# Patient Record
Sex: Female | Born: 1990 | Race: Asian | Hispanic: No | Marital: Married | Smoking: Never smoker
Health system: Southern US, Community
[De-identification: ages and names within clinical notes are randomized; demographics above are authoritative.]

## PROBLEM LIST (undated history)

## (undated) ENCOUNTER — Inpatient Hospital Stay (HOSPITAL_COMMUNITY): Payer: Self-pay

## (undated) DIAGNOSIS — Z789 Other specified health status: Secondary | ICD-10-CM

## (undated) HISTORY — PX: NO PAST SURGERIES: SHX2092

---

## 2014-12-13 NOTE — L&D Delivery Note (Signed)
Delivery Note At 11:55 PM a viable female was delivered via Vaginal, Vacuum (Extractor) (Presentation: ; Occiput Anterior).  APGAR: 9, 9; weight 6 lb 13.9 oz (3116 g).   Placenta status: Intact, Spontaneous.  Cord: 3 vessels with the following complications: None.  Cord pH: none  Anesthesia: Epidural  Episiotomy: Midline Lacerations: 3rd degree;Perineal Suture Repair: 2.0 chromic vicryl Est. Blood Loss (mL): 325  Mom to postpartum.  Baby to Couplet care / Skin to Skin.  Ryne Mctigue A 11/23/2015, 12:57 AM

## 2015-04-29 LAB — OB RESULTS CONSOLE GC/CHLAMYDIA
CHLAMYDIA, DNA PROBE: NEGATIVE
GC PROBE AMP, GENITAL: NEGATIVE

## 2015-04-29 LAB — OB RESULTS CONSOLE RUBELLA ANTIBODY, IGM: Rubella: IMMUNE

## 2015-04-29 LAB — OB RESULTS CONSOLE RPR: RPR: NONREACTIVE

## 2015-04-29 LAB — OB RESULTS CONSOLE HIV ANTIBODY (ROUTINE TESTING): HIV: NONREACTIVE

## 2015-04-29 LAB — PROCEDURE REPORT - SCANNED: PAP SMEAR: NEGATIVE

## 2015-04-29 LAB — OB RESULTS CONSOLE HEPATITIS B SURFACE ANTIGEN: Hepatitis B Surface Ag: NEGATIVE

## 2015-06-02 ENCOUNTER — Encounter (HOSPITAL_COMMUNITY): Payer: Self-pay | Admitting: *Deleted

## 2015-06-02 ENCOUNTER — Inpatient Hospital Stay (HOSPITAL_COMMUNITY)
Admission: AD | Admit: 2015-06-02 | Discharge: 2015-06-02 | Disposition: A | Discharge status: Home / Self Care | Payer: Managed Care, Other (non HMO) | Source: Ambulatory Visit | Attending: Obstetrics | Admitting: Obstetrics

## 2015-06-02 DIAGNOSIS — Z3A14 14 weeks gestation of pregnancy: Secondary | ICD-10-CM | POA: Insufficient documentation

## 2015-06-02 DIAGNOSIS — R040 Epistaxis: Secondary | ICD-10-CM | POA: Insufficient documentation

## 2015-06-02 DIAGNOSIS — O210 Mild hyperemesis gravidarum: Secondary | ICD-10-CM | POA: Diagnosis present

## 2015-06-02 DIAGNOSIS — O219 Vomiting of pregnancy, unspecified: Secondary | ICD-10-CM

## 2015-06-02 LAB — URINALYSIS, ROUTINE W REFLEX MICROSCOPIC
BILIRUBIN URINE: NEGATIVE
GLUCOSE, UA: NEGATIVE mg/dL
HGB URINE DIPSTICK: NEGATIVE
Ketones, ur: NEGATIVE mg/dL
Leukocytes, UA: NEGATIVE
Nitrite: NEGATIVE
PH: 7 (ref 5.0–8.0)
Protein, ur: NEGATIVE mg/dL
SPECIFIC GRAVITY, URINE: 1.01 (ref 1.005–1.030)
UROBILINOGEN UA: 0.2 mg/dL (ref 0.0–1.0)

## 2015-06-02 NOTE — Discharge Instructions (Signed)

## 2015-06-02 NOTE — MAU Provider Note (Signed)
History     CSN: [id number]  Arrival date and time: 06/02/15 1039   First Provider Initiated Contact with Patient 06/02/15 1125      Chief Complaint  Patient presents with  . Morning Sickness   HPIpt is G1P0  pregnant - presents with nausea and vomiting. Pt ate part of biscuit this morning and able to keep fluids down today Pt has had a nose bleed 2 days ago and is concerned- pt has hx of nosebleeds Pt has phenergan which helps her nausea when she takes it- no nausea at this time Pt denies any spotting/bleeding/LOF/abd pain  RN note: Nurse Addendum  MAU Note 06/02/2015 10:51 AM    Expand All Collapse All   Having nausea and vomiting (has not thrown up today), having nose bleeding(occ last was yesterday). Has nausea med- trying not to take. Called doctor was told to come here. Sometimes has pain, none currently         History reviewed. No pertinent past medical history.  History reviewed. No pertinent past surgical history.  History reviewed. No pertinent family history.  History  Substance Use Topics  . Smoking status: Never Smoker   . Smokeless tobacco: Not on file  . Alcohol Use: No    Allergies: No Known Allergies  Prescriptions prior to admission  Medication Sig Dispense Refill Last Dose  . Prenatal Vit-Fe Fumarate-FA (PRENATAL MULTIVITAMIN) TABS tablet Take 1 tablet by mouth daily at 12 noon.   06/01/2015 at Unknown time  . promethazine (PHENERGAN) 25 MG tablet Take 25 mg by mouth every 4 (four) hours as needed for nausea or vomiting.   06/02/2015 at Unknown time    Review of Systems  Constitutional: Negative for fever and chills.  Gastrointestinal: Positive for nausea and vomiting. Negative for abdominal pain, diarrhea and constipation.  Genitourinary: Negative for dysuria.  Neurological: Negative for headaches.   Physical Exam   Blood pressure 104/74, pulse 87, temperature 98.4 F (36.9 C), temperature source Oral, resp. rate 16, height   (1.575 m), weight 114 lb (51.71 kg).  Physical Exam  Nursing note and vitals reviewed. Constitutional: She is oriented to person, place, and time. She appears well-developed and well-nourished. No distress.  HENT:  Head: Normocephalic.  Eyes: Pupils are equal, round, and reactive to light.  Neck: Normal range of motion. Neck supple.  Cardiovascular: Normal rate.   Respiratory: Effort normal.  GI: Soft. She exhibits no distension. There is no tenderness. There is no rebound and no guarding.  FHR 167 with doppler  Musculoskeletal: Normal range of motion.  Neurological: She is alert and oriented to person, place, and time.  Skin: Skin is warm and dry.  Psychiatric: She has a normal mood and affect.    MAU Course  Procedures Results for orders placed or performed during the hospital encounter of 06/02/15 (from the past 24 hour(s))  Urinalysis, Routine w reflex microscopic (not at [facility])     Status: None   Collection Time: 06/02/15 11:00 AM  Result Value Ref Range   Color, Urine YELLOW YELLOW   APPearance CLEAR CLEAR   Specific Gravity, Urine 1.010 1.005 - 1.030   pH 7.0 5.0 - 8.0   Glucose, UA NEGATIVE NEGATIVE mg/dL   Hgb urine dipstick NEGATIVE NEGATIVE   Bilirubin Urine NEGATIVE NEGATIVE   Ketones, ur NEGATIVE NEGATIVE mg/dL   Protein, ur NEGATIVE NEGATIVE mg/dL   Urobilinogen, UA 0.2 0.0 - 1.0 mg/dL   Nitrite NEGATIVE NEGATIVE   Leukocytes, UA NEGATIVE NEGATIVE  Assessment and Plan  Hx of nose bleed Morning sickness F/u with Dr. Gaynell Face for [facility] appointment  [facility] 06/02/2015, 11:26 AM

## 2015-06-02 NOTE — MAU Note (Addendum)
Having nausea and vomiting (has not thrown up today), having nose bleeding(occ last was yesterday). Has nausea med- trying not to take.  Called doctor was told to come here.  Sometimes has pain, none currently

## 2015-07-04 ENCOUNTER — Other Ambulatory Visit (HOSPITAL_COMMUNITY): Payer: Self-pay | Admitting: Obstetrics

## 2015-07-04 DIAGNOSIS — Z3689 Encounter for other specified antenatal screening: Secondary | ICD-10-CM

## 2015-07-16 ENCOUNTER — Ambulatory Visit (HOSPITAL_COMMUNITY)
Admission: RE | Admit: 2015-07-16 | Discharge: 2015-07-16 | Disposition: A | Discharge status: Home / Self Care | Payer: Managed Care, Other (non HMO) | Source: Ambulatory Visit | Attending: Obstetrics | Admitting: Obstetrics

## 2015-07-16 DIAGNOSIS — Z36 Encounter for antenatal screening of mother: Secondary | ICD-10-CM | POA: Insufficient documentation

## 2015-07-16 DIAGNOSIS — Z3689 Encounter for other specified antenatal screening: Secondary | ICD-10-CM

## 2015-10-27 LAB — OB RESULTS CONSOLE GBS: GBS: POSITIVE

## 2015-11-03 ENCOUNTER — Other Ambulatory Visit (HOSPITAL_COMMUNITY): Payer: Self-pay | Admitting: Obstetrics

## 2015-11-03 DIAGNOSIS — Z369 Encounter for antenatal screening, unspecified: Secondary | ICD-10-CM

## 2015-11-03 DIAGNOSIS — Z3403 Encounter for supervision of normal first pregnancy, third trimester: Secondary | ICD-10-CM

## 2015-11-05 ENCOUNTER — Other Ambulatory Visit (HOSPITAL_COMMUNITY): Payer: Self-pay | Admitting: Obstetrics

## 2015-11-05 ENCOUNTER — Ambulatory Visit (HOSPITAL_COMMUNITY)
Admission: RE | Admit: 2015-11-05 | Discharge: 2015-11-05 | Disposition: A | Discharge status: Home / Self Care | Payer: Managed Care, Other (non HMO) | Source: Ambulatory Visit | Attending: Obstetrics | Admitting: Obstetrics

## 2015-11-05 DIAGNOSIS — Z369 Encounter for antenatal screening, unspecified: Secondary | ICD-10-CM

## 2015-11-05 DIAGNOSIS — Z0489 Encounter for examination and observation for other specified reasons: Secondary | ICD-10-CM

## 2015-11-05 DIAGNOSIS — Z36 Encounter for antenatal screening of mother: Secondary | ICD-10-CM | POA: Diagnosis not present

## 2015-11-05 DIAGNOSIS — Z3403 Encounter for supervision of normal first pregnancy, third trimester: Secondary | ICD-10-CM

## 2015-11-05 DIAGNOSIS — IMO0002 Reserved for concepts with insufficient information to code with codable children: Secondary | ICD-10-CM

## 2015-11-05 DIAGNOSIS — Z3A36 36 weeks gestation of pregnancy: Secondary | ICD-10-CM | POA: Insufficient documentation

## 2015-11-14 ENCOUNTER — Ambulatory Visit (HOSPITAL_COMMUNITY): Payer: Managed Care, Other (non HMO)

## 2015-11-20 ENCOUNTER — Encounter (HOSPITAL_COMMUNITY): Payer: Self-pay | Admitting: *Deleted

## 2015-11-20 ENCOUNTER — Inpatient Hospital Stay (HOSPITAL_COMMUNITY)
Admission: AD | Admit: 2015-11-20 | Discharge: 2015-11-20 | Disposition: A | Discharge status: Home / Self Care | Payer: Managed Care, Other (non HMO) | Source: Ambulatory Visit | Attending: Obstetrics | Admitting: Obstetrics

## 2015-11-20 HISTORY — DX: Other specified health status: Z78.9

## 2015-11-20 NOTE — MAU Note (Signed)
Pt stated having ctxs throughout the night with some bleeding. Good fetal movement reported.

## 2015-11-20 NOTE — Discharge Instructions (Signed)

## 2015-11-20 NOTE — MAU Note (Signed)
C/o ucs since 2200 last night; having some spotting; rated pain @ 7

## 2015-11-22 ENCOUNTER — Inpatient Hospital Stay (HOSPITAL_COMMUNITY): Payer: Managed Care, Other (non HMO) | Admitting: Anesthesiology

## 2015-11-22 ENCOUNTER — Encounter (HOSPITAL_COMMUNITY): Payer: Self-pay | Admitting: *Deleted

## 2015-11-22 ENCOUNTER — Inpatient Hospital Stay (HOSPITAL_COMMUNITY)
Admission: AD | Admit: 2015-11-22 | Discharge: 2015-11-24 | DRG: 775 | Disposition: A | Discharge status: Home / Self Care | Payer: Managed Care, Other (non HMO) | Source: Ambulatory Visit | Attending: Obstetrics | Admitting: Obstetrics

## 2015-11-22 DIAGNOSIS — Z3A39 39 weeks gestation of pregnancy: Secondary | ICD-10-CM | POA: Diagnosis not present

## 2015-11-22 DIAGNOSIS — Z8249 Family history of ischemic heart disease and other diseases of the circulatory system: Secondary | ICD-10-CM

## 2015-11-22 DIAGNOSIS — O99824 Streptococcus B carrier state complicating childbirth: Secondary | ICD-10-CM | POA: Diagnosis present

## 2015-11-22 DIAGNOSIS — IMO0001 Reserved for inherently not codable concepts without codable children: Secondary | ICD-10-CM

## 2015-11-22 DIAGNOSIS — E669 Obesity, unspecified: Secondary | ICD-10-CM | POA: Diagnosis present

## 2015-11-22 DIAGNOSIS — Z6825 Body mass index (BMI) 25.0-25.9, adult: Secondary | ICD-10-CM

## 2015-11-22 DIAGNOSIS — O99214 Obesity complicating childbirth: Secondary | ICD-10-CM | POA: Diagnosis present

## 2015-11-22 LAB — CBC
HEMATOCRIT: 42.7 % (ref 36.0–46.0)
HEMOGLOBIN: 14.9 g/dL (ref 12.0–15.0)
MCH: 30.7 pg (ref 26.0–34.0)
MCHC: 34.9 g/dL (ref 30.0–36.0)
MCV: 87.9 fL (ref 78.0–100.0)
PLATELETS: 237 10*3/uL (ref 150–400)
RBC: 4.86 MIL/uL (ref 3.87–5.11)
RDW: 13.1 % (ref 11.5–15.5)
WBC: 13.4 10*3/uL — AB (ref 4.0–10.5)

## 2015-11-22 LAB — TYPE AND SCREEN
ABO/RH(D): O POS
Antibody Screen: NEGATIVE

## 2015-11-22 LAB — URINE MICROSCOPIC-ADD ON

## 2015-11-22 LAB — URINALYSIS, ROUTINE W REFLEX MICROSCOPIC
Bilirubin Urine: NEGATIVE
Glucose, UA: NEGATIVE mg/dL
Ketones, ur: 40 mg/dL — AB
LEUKOCYTES UA: NEGATIVE
NITRITE: NEGATIVE
PROTEIN: NEGATIVE mg/dL
SPECIFIC GRAVITY, URINE: 1.01 (ref 1.005–1.030)
pH: 7.5 (ref 5.0–8.0)

## 2015-11-22 LAB — ABO/RH: ABO/RH(D): O POS

## 2015-11-22 MED ORDER — LACTATED RINGERS IV SOLN: 500.0000 mL | INTRAVENOUS | Status: DC | PRN

## 2015-11-22 MED ORDER — NALBUPHINE HCL 10 MG/ML IJ SOLN: 10.0000 mg | INTRAMUSCULAR | Status: DC | PRN

## 2015-11-22 MED ORDER — DIPHENHYDRAMINE HCL 50 MG/ML IJ SOLN: 12.5000 mg | INTRAMUSCULAR | Status: DC | PRN

## 2015-11-22 MED ORDER — CITRIC ACID-SODIUM CITRATE 334-500 MG/5ML PO SOLN: 30.0000 mL | ORAL | Status: DC | PRN

## 2015-11-22 MED ORDER — LIDOCAINE HCL (PF) 1 % IJ SOLN
30.0000 mL | INTRAMUSCULAR | Status: DC | PRN
  Administered 2015-11-22: 30 mL via SUBCUTANEOUS
  Filled 2015-11-22: qty 30

## 2015-11-22 MED ORDER — OXYTOCIN 40 UNITS IN LACTATED RINGERS INFUSION - SIMPLE MED
1.0000 m[IU]/min | INTRAVENOUS | Status: DC
  Administered 2015-11-22: 2 m[IU]/min via INTRAVENOUS
  Administered 2015-11-22: 6 m[IU]/min via INTRAVENOUS

## 2015-11-22 MED ORDER — PROMETHAZINE HCL 25 MG/ML IJ SOLN: 25.0000 mg | Freq: Four times a day (QID) | INTRAMUSCULAR | Status: DC | PRN

## 2015-11-22 MED ORDER — EPHEDRINE 5 MG/ML INJ
10.0000 mg | INTRAVENOUS | Status: DC | PRN
  Filled 2015-11-22: qty 2

## 2015-11-22 MED ORDER — ACETAMINOPHEN 325 MG PO TABS: 650.0000 mg | ORAL_TABLET | ORAL | Status: DC | PRN

## 2015-11-22 MED ORDER — ONDANSETRON HCL 4 MG/2ML IJ SOLN
4.0000 mg | Freq: Four times a day (QID) | INTRAMUSCULAR | Status: DC | PRN
  Administered 2015-11-22: 4 mg via INTRAVENOUS
  Filled 2015-11-22: qty 2

## 2015-11-22 MED ORDER — LACTATED RINGERS IV SOLN: INTRAVENOUS | Status: DC

## 2015-11-22 MED ORDER — LIDOCAINE HCL (PF) 1 % IJ SOLN
INTRAMUSCULAR | Status: AC
  Administered 2015-11-22: 4 mL via EPIDURAL
  Administered 2015-11-22: 3 mL via EPIDURAL
  Filled 2015-11-22: qty 30

## 2015-11-22 MED ORDER — OXYTOCIN 40 UNITS IN LACTATED RINGERS INFUSION - SIMPLE MED
INTRAVENOUS | Status: AC
  Filled 2015-11-22: qty 1000

## 2015-11-22 MED ORDER — FENTANYL 2.5 MCG/ML BUPIVACAINE 1/10 % EPIDURAL INFUSION (WH - ANES)
14.0000 mL/h | INTRAMUSCULAR | Status: DC | PRN
  Administered 2015-11-22: 13 mL/h via EPIDURAL
  Filled 2015-11-22: qty 125

## 2015-11-22 MED ORDER — OXYTOCIN 40 UNITS IN LACTATED RINGERS INFUSION - SIMPLE MED
62.5000 mL/h | INTRAVENOUS | Status: DC
  Filled 2015-11-22: qty 1000

## 2015-11-22 MED ORDER — PHENYLEPHRINE 40 MCG/ML (10ML) SYRINGE FOR IV PUSH (FOR BLOOD PRESSURE SUPPORT)
80.0000 ug | PREFILLED_SYRINGE | INTRAVENOUS | Status: DC | PRN
  Filled 2015-11-22: qty 20
  Filled 2015-11-22: qty 2

## 2015-11-22 MED ORDER — SODIUM CHLORIDE 0.9 % IV SOLN
2.0000 g | Freq: Once | INTRAVENOUS | Status: AC
  Administered 2015-11-22: 2 g via INTRAVENOUS
  Filled 2015-11-22: qty 2000

## 2015-11-22 MED ORDER — OXYCODONE-ACETAMINOPHEN 5-325 MG PO TABS: 2.0000 | ORAL_TABLET | ORAL | Status: DC | PRN

## 2015-11-22 MED ORDER — OXYTOCIN BOLUS FROM INFUSION
500.0000 mL | INTRAVENOUS | Status: DC
  Administered 2015-11-23: 500 mL via INTRAVENOUS

## 2015-11-22 MED ORDER — NALBUPHINE HCL 10 MG/ML IJ SOLN: 10.0000 mg | Freq: Four times a day (QID) | INTRAMUSCULAR | Status: DC | PRN

## 2015-11-22 MED ORDER — OXYCODONE-ACETAMINOPHEN 5-325 MG PO TABS
1.0000 | ORAL_TABLET | ORAL | Status: DC | PRN
  Filled 2015-11-22: qty 1

## 2015-11-22 MED ORDER — TERBUTALINE SULFATE 1 MG/ML IJ SOLN
0.2500 mg | Freq: Once | INTRAMUSCULAR | Status: DC | PRN
  Filled 2015-11-22: qty 1

## 2015-11-22 NOTE — MAU Note (Signed)
Sent urine to Lab

## 2015-11-22 NOTE — MAU Note (Signed)
Pt states contractions started 4 days ago and they started getting stronger last night.  Pt states she is having bleeding and discharge but her water has not broken.  Pt states she is feeling the baby move.

## 2015-11-22 NOTE — Progress Notes (Signed)
Bonnie CarneyHa Thu Gilmore is a 24 y.o. G1P0 at 3960w2d by LMP admitted for active labor  Subjective:   Objective: BP 106/61 mmHg  Pulse 70  Temp(Src) 97.9 F (36.6 C) (Oral)  Resp 18  Ht 5\' 3"  (1.6 m)  Wt 145 lb (65.772 kg)  BMI 25.69 kg/m2  SpO2 99%      FHT:  FHR: 140 bpm, variability: moderate,  accelerations:  Present,  decelerations:  Absent UC:   regular, every 2-3 minutes SVE:   Dilation: 10 Effacement (%): 100 Station: +1 Exam by:: patti moore rn  Labs: Lab Results  Component Value Date   WBC 13.4* 11/22/2015   HGB 14.9 11/22/2015   HCT 42.7 11/22/2015   MCV 87.9 11/22/2015   PLT 237 11/22/2015    Assessment / Plan: Augmentation of labor, progressing well  Labor: Progressing normally Preeclampsia:  none Fetal Wellbeing:  Category I Pain Control:  Epidural I/D:  n/a Anticipated MOD:  NSVD  HARPER,CHARLES A 11/22/2015, 7:30 PM

## 2015-11-22 NOTE — H&P (Signed)
Bonnie Gilmore is a 24 y.o. female presenting for UC's. Maternal Medical History:  Reason for admission: Contractions.   Fetal activity: Perceived fetal activity is normal.   Last perceived fetal movement was within the past hour.    Prenatal complications: no prenatal complications Prenatal Complications - Diabetes: none.    OB History    Gravida Para Term Preterm AB TAB SAB Ectopic Multiple Living   1              Past Medical History  Diagnosis Date  . Medical history non-contributory    Past Surgical History  Procedure Laterality Date  . No past surgeries     Family History: family history includes Hypertension in her mother. There is no history of Alcohol abuse, Arthritis, Asthma, Birth defects, Cancer, COPD, Depression, Diabetes, Drug abuse, Early death, Hearing loss, Heart disease, Hyperlipidemia, Kidney disease, Learning disabilities, Mental illness, Mental retardation, Miscarriages / Stillbirths, Stroke, Vision loss, or Varicose Veins. Social History:  reports that she has never smoked. She does not have any smokeless tobacco history on file. She reports that she does not drink alcohol or use illicit drugs.   Prenatal Transfer Tool  Maternal Diabetes: No Genetic Screening: Unknown Maternal Ultrasounds/Referrals: Normal Fetal Ultrasounds or other Referrals:  None Maternal Substance Abuse:  No Significant Maternal Medications:  None Significant Maternal Lab Results:  GBS positive Other Comments:  None  Review of Systems  All other systems reviewed and are negative.   Dilation: 7 Effacement (%): 90 Station: -1 Exam by:: Ronna PolioElizabeth D'Andrea, RN and Ninfa MeekerJudy Lowe, RN Blood pressure 115/79, pulse 84, temperature 97.6 F (36.4 C), resp. rate 18. Maternal Exam:  Uterine Assessment: Contraction strength is moderate.  Abdomen: Patient reports no abdominal tenderness. Fetal presentation: vertex  Introitus: Normal vulva. Normal vagina.  Cervix: Cervix evaluated by digital  exam.     Physical Exam  Nursing note and vitals reviewed. Constitutional: She is oriented to person, place, and time. She appears well-developed and well-nourished.  HENT:  Head: Normocephalic and atraumatic.  Eyes: Conjunctivae are normal. Pupils are equal, round, and reactive to light.  Neck: Normal range of motion. Neck supple.  Cardiovascular: Normal rate and regular rhythm.   Respiratory: Effort normal and breath sounds normal.  GI: Soft.  Genitourinary: Vagina normal and uterus normal.  Musculoskeletal: Normal range of motion.  Neurological: She is alert and oriented to person, place, and time.  Skin: Skin is warm and dry.  Psychiatric: She has a normal mood and affect. Her behavior is normal. Judgment and thought content normal.    Prenatal labs: ABO, Rh:   Antibody:   Rubella:   RPR:    HBsAg:    HIV:    GBS: Positive (11/14 0000)   Assessment/Plan: 39 weeks.  Active labor.  Admit.   Jaime Grizzell A 11/22/2015, 2:16 PM

## 2015-11-22 NOTE — Progress Notes (Signed)
Dr. Clearance CootsHarper called to see if he was on call for Dr. Gaynell FaceMarshall.  Dr. Clearance CootsHarper states he is.  Updated about pt being 7 cm, 90%, and -1.  Updated on contraction pattern and FHR.  Updated on positive GBS status.  Provider states he will put in admission orders.

## 2015-11-22 NOTE — Progress Notes (Signed)
Provider paged to notify of pt in MAU and cervical exam.

## 2015-11-22 NOTE — Anesthesia Preprocedure Evaluation (Signed)
Anesthesia Evaluation  Patient identified by MRN, date of birth, ID band Patient awake    Reviewed: Allergy & Precautions, Patient's Chart, lab work & pertinent test results  Airway Mallampati: II  TM Distance: >3 FB Neck ROM: Full    Dental no notable dental hx. (+) Teeth Intact   Pulmonary neg pulmonary ROS,    Pulmonary exam normal breath sounds clear to auscultation       Cardiovascular negative cardio ROS Normal cardiovascular exam Rhythm:Regular Rate:Normal     Neuro/Psych negative neurological ROS  negative psych ROS   GI/Hepatic negative GI ROS, Neg liver ROS,   Endo/Other  negative endocrine ROS  Renal/GU negative Renal ROS  negative genitourinary   Musculoskeletal negative musculoskeletal ROS (+)   Abdominal (+) - obese,   Peds  Hematology negative hematology ROS (+)   Anesthesia Other Findings   Reproductive/Obstetrics (+) Pregnancy                             Anesthesia Physical Anesthesia Plan  ASA: II  Anesthesia Plan: Epidural   Post-op Pain Management:    Induction:   Airway Management Planned: Natural Airway  Additional Equipment:   Intra-op Plan:   Post-operative Plan:   Informed Consent: I have reviewed the patients History and Physical, chart, labs and discussed the procedure including the risks, benefits and alternatives for the proposed anesthesia with the patient or authorized representative who has indicated his/her understanding and acceptance.     Plan Discussed with: Anesthesiologist  Anesthesia Plan Comments:         Anesthesia Quick Evaluation

## 2015-11-22 NOTE — Anesthesia Procedure Notes (Signed)
Epidural Patient location during procedure: OB Start time: 11/22/2015 3:26 PM  Staffing Anesthesiologist: Mal AmabileFOSTER, Jaxtin Raimondo Performed by: anesthesiologist   Preanesthetic Checklist Completed: patient identified, site marked, surgical consent, pre-op evaluation, timeout performed, IV checked, risks and benefits discussed and monitors and equipment checked  Epidural Patient position: sitting Prep: site prepped and draped and DuraPrep Patient monitoring: continuous pulse ox and blood pressure Approach: midline Location: L4-L5 Injection technique: LOR saline  Needle:  Needle type: Tuohy  Needle gauge: 17 G Needle length: 9 cm and 9 Needle insertion depth: 4 cm Catheter type: closed end flexible Catheter size: 19 Gauge Catheter at skin depth: 9 cm Test dose: negative and Other  Assessment Events: blood not aspirated, injection not painful, no injection resistance, negative IV test and no paresthesia  Additional Notes Patient identified. Risks and benefits discussed including failed block, incomplete  Pain control, post dural puncture headache, nerve damage, paralysis, blood pressure Changes, nausea, vomiting, reactions to medications-both toxic and allergic and post Partum back pain. All questions were answered. Patient expressed understanding and wished to proceed. Sterile technique was used throughout procedure. Epidural site was Dressed with sterile barrier dressing. No paresthesias, signs of intravascular injection Or signs of intrathecal spread were encountered.  Patient was more comfortable after the epidural was dosed. Please see RN's note for documentation of vital signs and FHR which are stable.

## 2015-11-22 NOTE — Progress Notes (Signed)
Provider paged a second time.  If no return call will call provider to notify about pt in MAU.

## 2015-11-23 ENCOUNTER — Encounter (HOSPITAL_COMMUNITY): Payer: Self-pay | Admitting: General Practice

## 2015-11-23 LAB — RPR: RPR Ser Ql: NONREACTIVE

## 2015-11-23 LAB — CBC
HCT: 37.9 % (ref 36.0–46.0)
Hemoglobin: 12.8 g/dL (ref 12.0–15.0)
MCH: 29.6 pg (ref 26.0–34.0)
MCHC: 33.8 g/dL (ref 30.0–36.0)
MCV: 87.7 fL (ref 78.0–100.0)
PLATELETS: 181 10*3/uL (ref 150–400)
RBC: 4.32 MIL/uL (ref 3.87–5.11)
RDW: 13 % (ref 11.5–15.5)
WBC: 16.5 10*3/uL — AB (ref 4.0–10.5)

## 2015-11-23 MED ORDER — OXYTOCIN 40 UNITS IN LACTATED RINGERS INFUSION - SIMPLE MED
62.5000 mL/h | INTRAVENOUS | Status: AC
  Administered 2015-11-23 (×2): 62.5 mL/h via INTRAVENOUS
  Filled 2015-11-23: qty 1000

## 2015-11-23 MED ORDER — ACETAMINOPHEN 325 MG PO TABS: 650.0000 mg | ORAL_TABLET | ORAL | Status: DC | PRN

## 2015-11-23 MED ORDER — IBUPROFEN 600 MG PO TABS
600.0000 mg | ORAL_TABLET | Freq: Four times a day (QID) | ORAL | Status: DC
  Administered 2015-11-23 – 2015-11-24 (×6): 600 mg via ORAL
  Filled 2015-11-23 (×6): qty 1

## 2015-11-23 MED ORDER — DIBUCAINE 1 % RE OINT
1.0000 | TOPICAL_OINTMENT | RECTAL | Status: DC | PRN
Start: 2015-11-23 — End: 2015-11-24

## 2015-11-23 MED ORDER — DIPHENHYDRAMINE HCL 25 MG PO CAPS: 25.0000 mg | ORAL_CAPSULE | Freq: Four times a day (QID) | ORAL | Status: DC | PRN

## 2015-11-23 MED ORDER — ZOLPIDEM TARTRATE 5 MG PO TABS: 5.0000 mg | ORAL_TABLET | Freq: Every evening | ORAL | Status: DC | PRN

## 2015-11-23 MED ORDER — OXYCODONE-ACETAMINOPHEN 5-325 MG PO TABS
1.0000 | ORAL_TABLET | ORAL | Status: DC | PRN
Start: 2015-11-23 — End: 2015-11-24
  Administered 2015-11-23: 1 via ORAL

## 2015-11-23 MED ORDER — LANOLIN HYDROUS EX OINT: TOPICAL_OINTMENT | CUTANEOUS | Status: DC | PRN

## 2015-11-23 MED ORDER — METHYLERGONOVINE MALEATE 0.2 MG PO TABS
0.2000 mg | ORAL_TABLET | Freq: Four times a day (QID) | ORAL | Status: AC
  Administered 2015-11-23 – 2015-11-24 (×5): 0.2 mg via ORAL
  Filled 2015-11-23 (×5): qty 1

## 2015-11-23 MED ORDER — BENZOCAINE-MENTHOL 20-0.5 % EX AERO
1.0000 "application " | INHALATION_SPRAY | CUTANEOUS | Status: DC | PRN
  Administered 2015-11-24: 1 via TOPICAL
  Filled 2015-11-23: qty 56

## 2015-11-23 MED ORDER — PRENATAL MULTIVITAMIN CH
1.0000 | ORAL_TABLET | Freq: Every day | ORAL | Status: DC
  Administered 2015-11-23 – 2015-11-24 (×2): 1 via ORAL
  Filled 2015-11-23 (×2): qty 1

## 2015-11-23 MED ORDER — METHYLERGONOVINE MALEATE 0.2 MG/ML IJ SOLN: 0.2000 mg | Freq: Once | INTRAMUSCULAR | Status: DC

## 2015-11-23 MED ORDER — OXYTOCIN 40 UNITS IN LACTATED RINGERS INFUSION - SIMPLE MED: 62.5000 mL/h | INTRAVENOUS | Status: DC | PRN

## 2015-11-23 MED ORDER — TETANUS-DIPHTH-ACELL PERTUSSIS 5-2.5-18.5 LF-MCG/0.5 IM SUSP: 0.5000 mL | Freq: Once | INTRAMUSCULAR | Status: DC

## 2015-11-23 MED ORDER — SENNOSIDES-DOCUSATE SODIUM 8.6-50 MG PO TABS
2.0000 | ORAL_TABLET | ORAL | Status: DC
  Administered 2015-11-24: 2 via ORAL
  Filled 2015-11-23: qty 2

## 2015-11-23 MED ORDER — ONDANSETRON HCL 4 MG PO TABS: 4.0000 mg | ORAL_TABLET | ORAL | Status: DC | PRN

## 2015-11-23 MED ORDER — SIMETHICONE 80 MG PO CHEW: 80.0000 mg | CHEWABLE_TABLET | ORAL | Status: DC | PRN

## 2015-11-23 MED ORDER — ONDANSETRON HCL 4 MG/2ML IJ SOLN: 4.0000 mg | INTRAMUSCULAR | Status: DC | PRN

## 2015-11-23 MED ORDER — WITCH HAZEL-GLYCERIN EX PADS
1.0000 "application " | MEDICATED_PAD | CUTANEOUS | Status: DC | PRN
  Administered 2015-11-24: 1 via TOPICAL

## 2015-11-23 MED ORDER — OXYCODONE-ACETAMINOPHEN 5-325 MG PO TABS: 2.0000 | ORAL_TABLET | ORAL | Status: DC | PRN

## 2015-11-23 NOTE — Lactation Note (Signed)
This note was copied from the chart of Bonnie Gilmore. Lactation Consultation Note  Patient Name: Bonnie Alanson AlyHa Galan ZOXWR'UToday's Date: 11/23/2015 Reason for consult: Initial assessment Per FOB who interprets Mom has put baby to breast 2 times today, but they are giving bottles due to visitors today and Mom not having milk.  Discussed with family the importance of putting baby to breast with each feeding to encourage milk production and for baby to learn how to breastfeed. Assisted Mom with positioning and latching baby at this visit. Baby has difficulty obtaining and sustaining good depth. Some tongue thrusting observed. Reviewed tummy sizes with family. Encouraged to start BF with each feeding and if they continue to supplement to let us help with a different method than the bottle (spoon, finger or cup feeding). Demonstrated to parents how to assess for good depth. Basic teaching reviewed. Lactation brochure left for review, advised of OP services and support group. Encouraged to call for assist with feedings.   Maternal Data    Feeding Feeding Type: Bottle Fed - Formula Nipple Type: Slow - flow  LATCH Score/Interventions                      Lactation Tools Discussed/Used     Consult Status Consult Status: Follow-up Date: 11/23/15 Follow-up type: In-patient    Alfred LevinsGranger, Madden Garron Ann 11/23/2015, 4:46 PM

## 2015-11-23 NOTE — Anesthesia Postprocedure Evaluation (Signed)
Anesthesia Post Note  Patient: Bonnie Gilmore  Procedure(s) Performed: * No procedures listed *  Patient location during evaluation: Mother Baby Anesthesia Type: Epidural Level of consciousness: awake and alert and oriented Pain management: pain level controlled Vital Signs Assessment: post-procedure vital signs reviewed and stable Respiratory status: spontaneous breathing, nonlabored ventilation and respiratory function stable Cardiovascular status: blood pressure returned to baseline Postop Assessment: no headache, no backache, patient able to bend at knees, no signs of nausea or vomiting and adequate PO intake Anesthetic complications: no    Last Vitals:  Filed Vitals:   11/23/15 0615 11/23/15 0810  BP: 100/62 110/67  Pulse: 76 63  Temp: 36.4 C 36.3 C  Resp: 18 18    Last Pain:  Filed Vitals:   11/23/15 0817  PainSc: 0-No pain                 Koden Hunzeker

## 2015-11-24 NOTE — Discharge Summary (Signed)
Obstetric Discharge Summary Reason for Admission: onset of labor Prenatal Procedures: none Intrapartum Procedures: spontaneous vaginal delivery Postpartum Procedures: none Complications-Operative and Postpartum: none HEMOGLOBIN  Date Value Ref Range Status  11/23/2015 12.8 12.0 - 15.0 g/dL Final   HCT  Date Value Ref Range Status  11/23/2015 37.9 36.0 - 46.0 % Final    Physical Exam:  General: alert Lochia: appropriate Uterine Fundus: firm Incision: healing well DVT Evaluation: No evidence of DVT seen on physical exam.  Discharge Diagnoses: Term Pregnancy-delivered  Discharge Information: Date: 11/24/2015 Activity: pelvic rest Diet: routine Medications: Percocet Condition: improved Instructions: refer to practice specific booklet Discharge to: home Follow-up Information    Follow up with Bonnie Gilmore,Bonnie Cerda A, MD.   Specialty:  Obstetrics and Gynecology   Contact information:   [address] [place] [id number] [phone]       Newborn Data: Live born female  Birth Weight: 6 lb 13.9 oz (3116 g) APGAR: 9, 9  Home with mother.  Bonnie Gilmore 11/24/2015, 7:02 AM

## 2015-11-24 NOTE — Discharge Instructions (Signed)
Discharge instructions   You can wash your hair  Shower  Eat what you want  Drink what you want  See me in 6 weeks  Your ankles are going to swell more in the next 2 weeks than when pregnant  No sex for 6 weeks   Kamarri Fischetti A, MD 11/24/2015

## 2015-11-24 NOTE — Progress Notes (Signed)
Patient ID: Bonnie LevelHa Thu Gilmore, female   DOB: 10/23/1991, 24 y.o.   MRN: [id number] Postpartum day one Blood pressure 115/71 pulse 68 respiration 18 afebrile Fundus firm Lochia moderate Legs negative doing well

## 2015-11-25 ENCOUNTER — Ambulatory Visit: Payer: Self-pay

## 2015-11-25 NOTE — Lactation Note (Signed)
This note was copied from the chart of Bonnie Gilmore. Lactation Consultation Note: Dad translated for me. Reports baby is nursing better but still hungry after nursing. Giving bottles of formula after most feedings. Mom reports breasts are feeling a little heavier this morning. Encouraged to always breast feed on both breasts then give formula if baby is still hungry. As milk supply increases baby should be satisfied after nursing. Baby asleep in bassinet and mom eating breakfast at this time. No questions at present To call prn  Patient Name: Bonnie AbbeBoy Bresha Gilmore ZOXWR'UToday's Date: 11/25/2015 Reason for consult: Follow-up assessment   Maternal Data Formula Feeding for Exclusion: No Has patient been taught Hand Expression?: Yes Does the patient have breastfeeding experience prior to this delivery?: No  Feeding    LATCH Score/Interventions                      Lactation Tools Discussed/Used     Consult Status Consult Status: Complete    Pamelia HoitWeeks, Henok Heacock D 11/25/2015, 9:01 AM

## 2015-11-26 ENCOUNTER — Ambulatory Visit: Payer: Self-pay

## 2015-11-26 NOTE — Lactation Note (Signed)
This note was copied from the chart of Bonnie Alanson AlyHa Bach. Lactation Consultation Note  Follow up visit made.  Baby was bottle fed one hour ago and sleeping at present.  Mom's breasts full.  She recently pumped one ounce of breast milk.  Mom still giving some formula.  Discussed with parents supply and demand and importance of discontinuing formula.  Instructed to call for feeding assist when baby begins to cue.  Patient Name: Bonnie Gilmore BJYNW'GToday's Date: 11/26/2015     Maternal Data    Feeding Length of feed: 10 min  LATCH Score/Interventions Latch: Grasps breast easily, tongue down, lips flanged, rhythmical sucking. Intervention(s): Assist with latch;Adjust position;Breast compression  Audible Swallowing: Spontaneous and intermittent  Type of Nipple: Everted at rest and after stimulation  Comfort (Breast/Nipple): Filling, red/small blisters or bruises, mild/mod discomfort     Hold (Positioning): Assistance needed to correctly position infant at breast and maintain latch.  LATCH Score: 8  Lactation Tools Discussed/Used     Consult Status      Huston FoleyMOULDEN, Jamere Stidham S 11/26/2015, 9:53 AM

## 2016-06-30 ENCOUNTER — Ambulatory Visit (INDEPENDENT_AMBULATORY_CARE_PROVIDER_SITE_OTHER): Payer: Managed Care, Other (non HMO) | Admitting: Obstetrics & Gynecology

## 2016-06-30 ENCOUNTER — Encounter: Payer: Self-pay | Admitting: Obstetrics & Gynecology

## 2016-06-30 ENCOUNTER — Telehealth: Payer: Self-pay | Admitting: Pediatrics

## 2016-06-30 ENCOUNTER — Ambulatory Visit: Payer: Managed Care, Other (non HMO)

## 2016-06-30 VITALS — BP 102/65 | HR 79 | Temp 98.5°F | Ht 64.0 in | Wt 136.4 lb

## 2016-06-30 DIAGNOSIS — Z3042 Encounter for surveillance of injectable contraceptive: Secondary | ICD-10-CM | POA: Diagnosis not present

## 2016-06-30 DIAGNOSIS — N921 Excessive and frequent menstruation with irregular cycle: Secondary | ICD-10-CM

## 2016-06-30 DIAGNOSIS — Z3202 Encounter for pregnancy test, result negative: Secondary | ICD-10-CM

## 2016-06-30 DIAGNOSIS — Z30011 Encounter for initial prescription of contraceptive pills: Secondary | ICD-10-CM

## 2016-06-30 LAB — POCT URINE PREGNANCY: Preg Test, Ur: NEGATIVE

## 2016-06-30 MED ORDER — MEDROXYPROGESTERONE ACETATE 150 MG/ML IM SUSP: 150.0000 mg | INTRAMUSCULAR | Status: DC

## 2016-06-30 MED ORDER — NORGESTIMATE-ETH ESTRADIOL 0.25-35 MG-MCG PO TABS: 1.0000 | ORAL_TABLET | Freq: Every day | ORAL | Status: DC

## 2016-06-30 NOTE — Telephone Encounter (Signed)
Left message for pt husband to return call.

## 2016-06-30 NOTE — Telephone Encounter (Signed)
If he still wants Depo Provera, [facility] to get for low or no cost. But if he wants a different modality, pills can be prescribed. Sprintec was prescribed in case this in the option they want to go with.  She needs to still return in 3 months for BP/OCP check if she starts OCPs. Please call to inform patient of recommendations.   Tereso NewcomerUgonna A Anyanwu, MD

## 2016-06-30 NOTE — Patient Instructions (Signed)

## 2016-06-30 NOTE — Telephone Encounter (Signed)
Left message for pt to return call.

## 2016-06-30 NOTE — Progress Notes (Signed)
   CLINIC ENCOUNTER NOTE  History:  25 y.o. G1P1001 here today for discussion about contraception. Accompanied by her husband and [place] interpreter; both helped with interpretation for the patient.  She had a SVD in December 2016 and was initially on Micronor, which was switched to Depo Provera in April 2017. Since the Depo Provera, she had irregular bleeding, not heavy, no associated symptoms.   She denies any abnormal vaginal discharge, pelvic pain or other concerns.   Past Medical History  Diagnosis Date  . Medical history non-contributory     Past Surgical History  Procedure Laterality Date  . No past surgeries      The following portions of the patient's history were reviewed and updated as appropriate: allergies, current medications, past family history, past medical history, past social history, past surgical history and problem list.   Health Maintenance:  Normal pap last year.  Review of Systems:  Pertinent items noted in HPI and remainder of comprehensive ROS otherwise negative.  Objective:  Physical Exam BP 102/65 mmHg  Pulse 79  Temp(Src) 98.5 F (36.9 C) (Oral)  Ht 5\' 4"  (1.626 m)  Wt 136 lb 6.4 oz (61.871 kg)  BMI 23.40 kg/m2  LMP  (LMP Unknown) CONSTITUTIONAL: Well-developed, well-nourished female in no acute distress.  HENT:  Normocephalic, atraumatic. External right and left ear normal. Oropharynx is clear and moist EYES: Conjunctivae and EOM are normal. Pupils are equal, round, and reactive to light. No scleral icterus.  NECK: Normal range of motion, supple, no masses SKIN: Skin is warm and dry. No rash noted. Not diaphoretic. No erythema. No pallor. NEUROLOGIC: Alert and oriented to person, place, and time. Normal reflexes, muscle tone coordination. No cranial nerve deficit noted. PSYCHIATRIC: Normal mood and affect. Normal behavior. Normal judgment and thought content. CARDIOVASCULAR: Normal heart rate noted RESPIRATORY: Effort and breath sounds normal, no  problems with respiration noted ABDOMEN: Soft, no distention noted.   PELVIC: Deferred MUSCULOSKELETAL: Normal range of motion. No edema noted.  Results for orders placed or performed in visit on 06/30/16 (from the past 24 hour(s))  POCT urine pregnancy     Status: Normal   Collection Time: 06/30/16  8:51 AM  Result Value Ref Range   Preg Test, Ur Negative Negative    Assessment & Plan:  1. Breakthrough bleeding on depo provera Patient was reassured that this was a normal side effect of the Depo Provera.  She was offered a chance to switch contraception modalities but she declines saying it is okay for now. If worsens, may do trial of OCPs to help with BTB.    2. Depo-Provera contraceptive status - POCT urine pregnancy - medroxyPROGESTERone (DEPO-PROVERA) 150 MG/ML injection; Inject 1 mL (150 mg total) into the muscle every 3 (three) months.  Dispense: 1 mL; Refill: 4 Patient will bring this back later today for injection. She was told to call/come in for worsening side effects on other concerns. Return in about 3 months (around 09/30/2016) for Depo Provera (RN injection visit).   Routine preventative health maintenance measures emphasized. Please refer to After Visit Summary for other counseling recommendations.   Total face-to-face time with patient: 20 minutes. Over 50% of encounter was spent on counseling and coordination of care.   Jaynie CollinsUGONNA  Jakeim Sedore, MD, FACOG Attending Obstetrician & Gynecologist, [facility], [facility]

## 2016-06-30 NOTE — Telephone Encounter (Signed)
Patients husband called from pharmacy and states his insurance does not cover Depo injection.  He would like to change to a different type on birth control. Please advise.

## 2016-07-07 ENCOUNTER — Ambulatory Visit (INDEPENDENT_AMBULATORY_CARE_PROVIDER_SITE_OTHER): Payer: Managed Care, Other (non HMO) | Admitting: *Deleted

## 2016-07-07 VITALS — BP 112/71 | HR 85 | Temp 98.8°F | Ht 64.0 in | Wt 136.4 lb

## 2016-07-07 DIAGNOSIS — Z3042 Encounter for surveillance of injectable contraceptive: Secondary | ICD-10-CM | POA: Diagnosis not present

## 2016-07-07 DIAGNOSIS — Z30013 Encounter for initial prescription of injectable contraceptive: Secondary | ICD-10-CM

## 2016-07-07 MED ORDER — MEDROXYPROGESTERONE ACETATE 150 MG/ML IM SUSP
150.0000 mg | Freq: Once | INTRAMUSCULAR | Status: AC
Start: 2016-07-07 — End: 2016-07-07
  Administered 2016-07-07: 150 mg via INTRAMUSCULAR

## 2016-07-07 NOTE — Progress Notes (Signed)
Patient presented in the office today for administration of birth control, Depo-Provera, by injection in her left arm. Patient provided her own medication that she brought to the office from her pharmacy. Patient tolerated well.

## 2016-07-26 ENCOUNTER — Encounter: Payer: Self-pay | Admitting: *Deleted

## 2016-07-26 NOTE — Telephone Encounter (Signed)
Letter mailed with options for provider

## 2016-09-08 ENCOUNTER — Encounter: Payer: Self-pay | Admitting: *Deleted

## 2016-09-28 ENCOUNTER — Ambulatory Visit: Payer: Self-pay

## 2016-09-30 ENCOUNTER — Ambulatory Visit: Payer: Managed Care, Other (non HMO)

## 2016-10-01 ENCOUNTER — Ambulatory Visit (INDEPENDENT_AMBULATORY_CARE_PROVIDER_SITE_OTHER): Payer: Managed Care, Other (non HMO)

## 2016-10-01 VITALS — BP 106/74 | HR 75 | Temp 98.5°F | Wt 131.4 lb

## 2016-10-01 DIAGNOSIS — Z3042 Encounter for surveillance of injectable contraceptive: Secondary | ICD-10-CM

## 2016-10-01 DIAGNOSIS — Z308 Encounter for other contraceptive management: Secondary | ICD-10-CM

## 2016-10-01 MED ORDER — MEDROXYPROGESTERONE ACETATE 150 MG/ML IM SUSP
150.0000 mg | Freq: Once | INTRAMUSCULAR | Status: AC
  Administered 2016-10-01: 150 mg via INTRAMUSCULAR

## 2016-10-01 NOTE — Progress Notes (Signed)
Patient in office today for routine depo injection. Patient tolerated well, given in right deltoid. Pt. Advised to RTO on 12-17-16 through 12-31-16 for routine depo injection.

## 2016-12-22 ENCOUNTER — Ambulatory Visit: Payer: Managed Care, Other (non HMO)

## 2016-12-28 ENCOUNTER — Ambulatory Visit (INDEPENDENT_AMBULATORY_CARE_PROVIDER_SITE_OTHER): Payer: Managed Care, Other (non HMO)

## 2016-12-28 DIAGNOSIS — Z3042 Encounter for surveillance of injectable contraceptive: Secondary | ICD-10-CM

## 2016-12-28 MED ORDER — MEDROXYPROGESTERONE ACETATE 150 MG/ML IM SUSP
150.0000 mg | Freq: Once | INTRAMUSCULAR | Status: AC
  Administered 2016-12-28: 150 mg via INTRAMUSCULAR

## 2016-12-28 NOTE — Progress Notes (Signed)
Nurse visit for Depo injection given R Deltoid. Next Depo due 03/21/17.

## 2017-03-22 ENCOUNTER — Ambulatory Visit (INDEPENDENT_AMBULATORY_CARE_PROVIDER_SITE_OTHER): Payer: Self-pay

## 2017-03-22 DIAGNOSIS — Z308 Encounter for other contraceptive management: Secondary | ICD-10-CM

## 2017-03-22 DIAGNOSIS — Z3042 Encounter for surveillance of injectable contraceptive: Secondary | ICD-10-CM

## 2017-03-22 MED ORDER — MEDROXYPROGESTERONE ACETATE 150 MG/ML IM SUSP
150.0000 mg | Freq: Once | INTRAMUSCULAR | Status: AC
  Administered 2017-03-22: 150 mg via INTRAMUSCULAR

## 2017-03-22 NOTE — Progress Notes (Signed)
Nurse visit for Depo given L Del w/o difficulty. Next Depo given. Next Depo due July 2.

## 2017-06-14 ENCOUNTER — Ambulatory Visit: Payer: Managed Care, Other (non HMO)

## 2017-06-20 ENCOUNTER — Ambulatory Visit (INDEPENDENT_AMBULATORY_CARE_PROVIDER_SITE_OTHER): Payer: BLUE CROSS/BLUE SHIELD

## 2017-06-20 DIAGNOSIS — Z3042 Encounter for surveillance of injectable contraceptive: Secondary | ICD-10-CM | POA: Diagnosis not present

## 2017-06-20 MED ORDER — MEDROXYPROGESTERONE ACETATE 150 MG/ML IM SUSP
150.0000 mg | Freq: Once | INTRAMUSCULAR | Status: AC
  Administered 2017-06-20: 150 mg via INTRAMUSCULAR

## 2017-06-20 NOTE — Progress Notes (Signed)
Pt here for depo. Inj given. Pt advised to schedule appt for 09/11/17. Pt tolerated well.

## 2017-09-12 ENCOUNTER — Other Ambulatory Visit: Payer: Self-pay

## 2017-09-12 ENCOUNTER — Telehealth: Payer: Self-pay

## 2017-09-12 DIAGNOSIS — Z3042 Encounter for surveillance of injectable contraceptive: Secondary | ICD-10-CM

## 2017-09-12 MED ORDER — MEDROXYPROGESTERONE ACETATE 150 MG/ML IM SUSP: 150.0000 mg | INTRAMUSCULAR | 0 refills | Status: DC

## 2017-09-12 NOTE — Telephone Encounter (Signed)
Returned call via interpretor and left vm for pt to call back regarding rx for [facility].

## 2017-09-13 ENCOUNTER — Ambulatory Visit: Payer: BLUE CROSS/BLUE SHIELD

## 2017-09-15 ENCOUNTER — Ambulatory Visit (INDEPENDENT_AMBULATORY_CARE_PROVIDER_SITE_OTHER): Payer: BLUE CROSS/BLUE SHIELD | Admitting: Obstetrics

## 2017-09-15 ENCOUNTER — Other Ambulatory Visit: Payer: Self-pay | Admitting: Obstetrics

## 2017-09-15 ENCOUNTER — Encounter: Payer: Self-pay | Admitting: Obstetrics

## 2017-09-15 ENCOUNTER — Ambulatory Visit: Payer: BLUE CROSS/BLUE SHIELD | Admitting: Certified Nurse Midwife

## 2017-09-15 VITALS — BP 120/79 | HR 89 | Ht 64.0 in | Wt 132.2 lb

## 2017-09-15 DIAGNOSIS — Z3042 Encounter for surveillance of injectable contraceptive: Secondary | ICD-10-CM

## 2017-09-15 MED ORDER — MEDROXYPROGESTERONE ACETATE 150 MG/ML IM SUSP: 150.0000 mg | INTRAMUSCULAR | 4 refills | Status: DC

## 2017-09-15 MED ORDER — MEDROXYPROGESTERONE ACETATE 150 MG/ML IM SUSP
150.0000 mg | Freq: Once | INTRAMUSCULAR | Status: AC
  Administered 2017-09-15: 150 mg via INTRAMUSCULAR

## 2017-09-15 NOTE — Progress Notes (Signed)
Pt here for depo. Inj given in right deltoid. Pt tolerated well. Next depo due between 12/20 and 1/3.

## 2017-09-15 NOTE — Progress Notes (Signed)
Subjective:    Bonnie Gilmore is a 26 y.o. female who presents for contraception counseling. The patient has no complaints today. The patient is sexually active. Pertinent past medical history: none.  The information documented in the HPI was reviewed and verified.  Menstrual History: OB History    Gravida Para Term Preterm AB Living   SAB TAB Ectopic Multiple Live Births         0 1      No LMP recorded. Patient has had an injection.   There are no active problems to display for this patient.  Past Medical History:  Diagnosis Date  . Medical history non-contributory     Past Surgical History:  Procedure Laterality Date  . NO PAST SURGERIES       Current Outpatient Prescriptions:  .  medroxyPROGESTERone (DEPO-PROVERA) 150 MG/ML injection, Inject 1 mL (150 mg total) into the muscle every 3 (three) months., Disp: 1 mL, Rfl: 0 No Known Allergies  Social History  Substance Use Topics  . Smoking status: Never Smoker  . Smokeless tobacco: Never Used  . Alcohol use No    Family History  Problem Relation Age of Onset  . Hypertension Mother   . Alcohol abuse Neg Hx   . Arthritis Neg Hx   . Asthma Neg Hx   . Birth defects Neg Hx   . Cancer Neg Hx   . COPD Neg Hx   . Depression Neg Hx   . Diabetes Neg Hx   . Drug abuse Neg Hx   . Early death Neg Hx   . Hearing loss Neg Hx   . Heart disease Neg Hx   . Hyperlipidemia Neg Hx   . Kidney disease Neg Hx   . Learning disabilities Neg Hx   . Mental illness Neg Hx   . Mental retardation Neg Hx   . Miscarriages / Stillbirths Neg Hx   . Stroke Neg Hx   . Vision loss Neg Hx   . Varicose Veins Neg Hx        Review of Systems Constitutional: negative for weight loss Genitourinary:negative for abnormal menstrual periods and vaginal discharge   Objective:   BP 120/79   Pulse 89   Ht  (1.626 m)   Wt 132 lb 3.2 oz (60 kg)   BMI 22.69 kg/m    General:   alert  Skin:   no rash or abnormalities  Lungs:    clear to auscultation bilaterally  Heart:   regular rate and rhythm, S1, S2 normal, no murmur, click, rub or gallop  Breasts:   normal without suspicious masses, skin or nipple changes or axillary nodes  Abdomen:  normal findings: no organomegaly, soft, non-tender and no hernia  Lab Review Urine pregnancy test Labs reviewed yes Radiologic studies reviewed no  50% of 15 min visit spent on counseling and coordination of care.    Assessment:    26 y.o., continuing Depo-Provera injections, no contraindications.   Plan:    All questions answered. Contraception: Depo-Provera injections. Discussed healthy lifestyle modifications. Follow up in 2 months.  Annual.  Meds ordered this encounter  Medications  . medroxyPROGESTERone (DEPO-PROVERA) injection 150 mg   No orders of the defined types were placed in this encounter.

## 2017-12-01 ENCOUNTER — Ambulatory Visit: Payer: BLUE CROSS/BLUE SHIELD

## 2017-12-09 ENCOUNTER — Ambulatory Visit (INDEPENDENT_AMBULATORY_CARE_PROVIDER_SITE_OTHER): Payer: BLUE CROSS/BLUE SHIELD

## 2017-12-09 VITALS — BP 109/73 | HR 91 | Wt 132.8 lb

## 2017-12-09 DIAGNOSIS — Z3042 Encounter for surveillance of injectable contraceptive: Secondary | ICD-10-CM | POA: Diagnosis not present

## 2017-12-09 MED ORDER — MEDROXYPROGESTERONE ACETATE 150 MG/ML IM SUSP
150.0000 mg | Freq: Once | INTRAMUSCULAR | Status: AC
  Administered 2017-12-09: 150 mg via INTRAMUSCULAR

## 2017-12-09 NOTE — Progress Notes (Signed)
Agree with nursing staff's documentation of this patient's clinic encounter.  Bonnie AntiguaPeggy Mixtli Reno, MD 12/09/2017 9:39 AM

## 2017-12-09 NOTE — Progress Notes (Signed)
Presents for DEPO, given in left deltoid, tolerated well. Patient supplied.  Next DEPO 3/15-29/2019  Administrations This Visit    medroxyPROGESTERone (DEPO-PROVERA) injection 150 mg    Admin Date 12/09/2017 Action Given Dose 150 mg Route Intramuscular Administered By Maretta BeesMcGlashan, Willi Borowiak J, RMA

## 2017-12-14 ENCOUNTER — Ambulatory Visit: Payer: BLUE CROSS/BLUE SHIELD

## 2018-02-28 ENCOUNTER — Encounter: Payer: Self-pay | Admitting: *Deleted

## 2018-03-09 ENCOUNTER — Ambulatory Visit (INDEPENDENT_AMBULATORY_CARE_PROVIDER_SITE_OTHER): Payer: BLUE CROSS/BLUE SHIELD

## 2018-03-09 DIAGNOSIS — Z3042 Encounter for surveillance of injectable contraceptive: Secondary | ICD-10-CM | POA: Diagnosis not present

## 2018-03-09 MED ORDER — MEDROXYPROGESTERONE ACETATE 150 MG/ML IM SUSP
150.0000 mg | Freq: Once | INTRAMUSCULAR | Status: AC
  Administered 2018-03-09: 150 mg via INTRAMUSCULAR

## 2018-03-09 NOTE — Progress Notes (Signed)
Nurse visit for pt supplied Depo. Pt is on time for inj. Next Depo due 6/13-27. Annual is due. Pt will schedule annual at checkout.

## 2018-03-09 NOTE — Progress Notes (Signed)
I have reviewed the chart and agree with nursing staff's documentation of this patient's encounter.  Catalina AntiguaPeggy Freja Faro, MD 03/09/2018 4:37 PM

## 2018-03-16 ENCOUNTER — Ambulatory Visit (INDEPENDENT_AMBULATORY_CARE_PROVIDER_SITE_OTHER): Payer: 59 | Admitting: Certified Nurse Midwife

## 2018-03-16 ENCOUNTER — Encounter: Payer: Self-pay | Admitting: Certified Nurse Midwife

## 2018-03-16 VITALS — BP 122/85 | HR 78 | Temp 99.7°F | Resp 16 | Wt 134.8 lb

## 2018-03-16 DIAGNOSIS — Z01419 Encounter for gynecological examination (general) (routine) without abnormal findings: Secondary | ICD-10-CM

## 2018-03-16 DIAGNOSIS — N898 Other specified noninflammatory disorders of vagina: Secondary | ICD-10-CM

## 2018-03-16 DIAGNOSIS — Z124 Encounter for screening for malignant neoplasm of cervix: Secondary | ICD-10-CM

## 2018-03-16 DIAGNOSIS — Z01411 Encounter for gynecological examination (general) (routine) with abnormal findings: Secondary | ICD-10-CM

## 2018-03-16 DIAGNOSIS — L309 Dermatitis, unspecified: Secondary | ICD-10-CM

## 2018-03-16 DIAGNOSIS — Z113 Encounter for screening for infections with a predominantly sexual mode of transmission: Secondary | ICD-10-CM

## 2018-03-16 MED ORDER — CLOBETASOL PROPIONATE 0.05 % EX OINT: 1.0000 "application " | TOPICAL_OINTMENT | Freq: Two times a day (BID) | CUTANEOUS | 0 refills | Status: DC

## 2018-03-16 NOTE — Progress Notes (Signed)
Subjective:        Bonnie Gilmore is a 27 y.o. female here for a routine exam.  Current complaints: vaginal discharge with itching, due for annual exam.  Former Dr. Gaynell FaceMarshall patient.  Is currently on Depo provera injections.  Currently sexually active.  Has translator.   Desires full STD screening exam.  States labial itching mostly at night that started two months ago after switching soaps.  Encouraged her to use a neutral soap on labia.    Personal health questionnaire:  Is patient Ashkenazi Jewish, have a family history of breast and/or ovarian cancer: no Is there a family history of uterine cancer diagnosed at age < 3350, gastrointestinal cancer, urinary tract cancer, family member who is a [name]: no Is the patient overweight and hypertensive, family history of diabetes, personal history of gestational diabetes, preeclampsia or PCOS: no Is patient over 6755, have PCOS,  family history of premature CHD under age 26, diabetes, smoke, have hypertension or peripheral artery disease:  no At any time, has a partner hit, kicked or otherwise hurt or frightened you?: no Over the past 2 weeks, have you felt down, depressed or hopeless?: no Over the past 2 weeks, have you felt little interest or pleasure in doing things?:not asked   Gynecologic History No LMP recorded. Patient has had an injection. Contraception: Depo-Provera injections Last Pap: unknown.  Last mammogram: n/a <40 years, no significant family hx.  Obstetric History OB History  Gravida Para Term Preterm AB Living  1 1 1     1   SAB TAB Ectopic Multiple Live Births        0 1    # Outcome Date GA Lbr Len/2nd Weight Sex Delivery Anes PTL Lv  1 Term 11/22/15 [id number] 22:52 / 05:03 6 lb 13.9 oz (3.116 kg) M Vag-Vacuum EPI  LIV    Past Medical History:  Diagnosis Date  . Medical history non-contributory     Past Surgical History:  Procedure Laterality Date  . NO PAST SURGERIES       Current  Outpatient Medications:  .  medroxyPROGESTERone (DEPO-PROVERA) 150 MG/ML injection, Inject 1 mL (150 mg total) into the muscle every 3 (three) months., Disp: 1 mL, Rfl: 4 .  clobetasol ointment (TEMOVATE) 0.05 %, Apply 1 application topically 2 (two) times daily., Disp: 30 g, Rfl: 0 No Known Allergies  Social History   Tobacco Use  . Smoking status: Never Smoker  . Smokeless tobacco: Never Used  Substance Use Topics  . Alcohol use: No    Family History  Problem Relation Age of Onset  . Hypertension Mother   . Alcohol abuse Neg Hx   . Arthritis Neg Hx   . Asthma Neg Hx   . Birth defects Neg Hx   . Cancer Neg Hx   . COPD Neg Hx   . Depression Neg Hx   . Diabetes Neg Hx   . Drug abuse Neg Hx   . Early death Neg Hx   . Hearing loss Neg Hx   . Heart disease Neg Hx   . Hyperlipidemia Neg Hx   . Kidney disease Neg Hx   . Learning disabilities Neg Hx   . Mental illness Neg Hx   . Mental retardation Neg Hx   . Miscarriages / Stillbirths Neg Hx   . Stroke Neg Hx   . Vision loss Neg Hx   . Varicose Veins Neg Hx       Review of Systems  Constitutional: negative for fatigue and weight loss Respiratory: negative for cough and wheezing Cardiovascular: negative for chest pain, fatigue and palpitations Gastrointestinal: negative for abdominal pain and change in bowel habits Musculoskeletal:negative for myalgias Neurological: negative for gait problems and tremors Behavioral/Psych: negative for abusive relationship, depression Endocrine: negative for temperature intolerance    Genitourinary:negative for abnormal menstrual periods, genital lesions, hot flashes, sexual problems and vaginal discharge Integument/breast: negative for breast lump, breast tenderness, nipple discharge and skin lesion(s)    Objective:       BP 122/85 (BP Location: Right Arm, Patient Position: Sitting, Cuff Size: Small)   Pulse 78   Temp 99.7 F (37.6 C) (Oral)   Resp 16   Wt 134 lb 12.8 oz (61.1 kg)    BMI 23.14 kg/m  General:   alert  Skin:   no rash or abnormalities  Lungs:   clear to auscultation bilaterally  Heart:   regular rate and rhythm, S1, S2 normal, no murmur, click, rub or gallop  Breasts:   normal without suspicious masses, skin or nipple changes or axillary nodes  Abdomen:  normal findings: no organomegaly, soft, non-tender and no hernia  Pelvis:  External genitalia: normal general appearance Urinary system: urethral meatus normal and bladder without fullness, nontender Vaginal: normal without tenderness, induration or masses Cervix: normal appearance Adnexa: normal bimanual exam Uterus: anteverted and non-tender, normal size   Lab Review Urine pregnancy test Labs reviewed yes Radiologic studies reviewed no  50% of 30 min visit spent on counseling and coordination of care.   Assessment & Plan    Healthy female exam.    1. Dermatitis    Labia: most likely d/t soap allergy.  - clobetasol ointment (TEMOVATE) 0.05 %; Apply 1 application topically 2 (two) times daily.  Dispense: 30 g; Refill: 0  2. Screen for STD (sexually transmitted disease)    - RPR - Hepatitis C antibody - Hepatitis B surface antigen - HIV antibody - Cervicovaginal ancillary only  3. Vaginal discharge    - Cervicovaginal ancillary only  4. Encounter for well woman exam    - Cytology - PAP     Education reviewed: calcium supplements, depression evaluation, low fat, low cholesterol diet, safe sex/STD prevention, self breast exams, skin cancer screening and weight bearing exercise. Contraception: Depo-Provera injections. Follow up in: 1 year.   Meds ordered this encounter  Medications  . clobetasol ointment (TEMOVATE) 0.05 %    Sig: Apply 1 application topically 2 (two) times daily.    Dispense:  30 g    Refill:  0   Orders Placed This Encounter  Procedures  . RPR  . Hepatitis C antibody  . Hepatitis B surface antigen  . HIV antibody   Possible management options  include: skin biopsy for unresolved labial dermatitis Follow up as needed.

## 2018-03-17 LAB — HIV ANTIBODY (ROUTINE TESTING W REFLEX): HIV SCREEN 4TH GENERATION: NONREACTIVE

## 2018-03-17 LAB — HEPATITIS B SURFACE ANTIGEN: Hepatitis B Surface Ag: NEGATIVE

## 2018-03-17 LAB — HEPATITIS C ANTIBODY: Hep C Virus Ab: <0.1 s/co ratio (ref 0.0–0.9)

## 2018-03-17 LAB — RPR: RPR Ser Ql: NONREACTIVE

## 2018-03-20 LAB — CERVICOVAGINAL ANCILLARY ONLY
BACTERIAL VAGINITIS: NEGATIVE
Candida vaginitis: NEGATIVE
Chlamydia: NEGATIVE
NEISSERIA GONORRHEA: NEGATIVE
Trichomonas: NEGATIVE

## 2018-03-21 ENCOUNTER — Other Ambulatory Visit: Payer: Self-pay | Admitting: Certified Nurse Midwife

## 2018-03-21 LAB — CYTOLOGY - PAP: Diagnosis: NEGATIVE

## 2018-06-08 ENCOUNTER — Ambulatory Visit (INDEPENDENT_AMBULATORY_CARE_PROVIDER_SITE_OTHER): Payer: 59 | Admitting: *Deleted

## 2018-06-08 VITALS — Wt 135.0 lb

## 2018-06-08 DIAGNOSIS — Z3042 Encounter for surveillance of injectable contraceptive: Secondary | ICD-10-CM | POA: Diagnosis not present

## 2018-06-08 MED ORDER — MEDROXYPROGESTERONE ACETATE 150 MG/ML IM SUSP
150.0000 mg | Freq: Once | INTRAMUSCULAR | Status: AC
  Administered 2018-06-08: 150 mg via INTRAMUSCULAR

## 2018-06-08 NOTE — Progress Notes (Signed)
Pt is in office for depo injection. Pt is on time for injection.  Pt tolerated injection well.  Pt advised to RTO on 08/30/18 for next depo. Pt has no other concerns today.  Wt 135 lb (61.2 kg)   BMI 23.17 kg/m   Administrations This Visit    medroxyPROGESTERone (DEPO-PROVERA) injection 150 mg    Admin Date 06/08/2018 Action Given Dose 150 mg Route Intramuscular Administered By Lanney GinsFoster, Adellyn Capek D, CMA

## 2018-06-08 NOTE — Progress Notes (Signed)
I have reviewed the chart and agree with nursing staff's documentation of this patient's encounter.  Bonnie Gilmore, CNM 06/08/2018 4:51 PM

## 2018-09-04 ENCOUNTER — Ambulatory Visit (INDEPENDENT_AMBULATORY_CARE_PROVIDER_SITE_OTHER): Payer: 59 | Admitting: *Deleted

## 2018-09-04 VITALS — BP 119/84 | HR 105 | Wt 137.0 lb

## 2018-09-04 DIAGNOSIS — Z3042 Encounter for surveillance of injectable contraceptive: Secondary | ICD-10-CM | POA: Diagnosis not present

## 2018-09-04 MED ORDER — MEDROXYPROGESTERONE ACETATE 150 MG/ML IM SUSP
150.0000 mg | Freq: Once | INTRAMUSCULAR | Status: AC
  Administered 2018-09-04: 150 mg via INTRAMUSCULAR

## 2018-09-04 NOTE — Progress Notes (Signed)
Pt is in office for Depo injection. Pt is on time for injection. Pt tolerated injection well. Pt advised RTO 12/3-12/23 for next depo.  Pt has no other concerns today.   BP 119/84   Pulse (!) 105   Wt 137 lb (62.1 kg)   BMI 23.52 kg/m   Administrations This Visit    medroxyPROGESTERone (DEPO-PROVERA) injection 150 mg    Admin Date 09/04/2018 Action Given Dose 150 mg Route Intramuscular Administered By Lanney GinsFoster, Marijah Larranaga D, CMA

## 2018-12-04 ENCOUNTER — Ambulatory Visit: Payer: 59

## 2018-12-11 ENCOUNTER — Telehealth: Payer: Self-pay | Admitting: Obstetrics

## 2018-12-15 ENCOUNTER — Encounter: Payer: Self-pay | Admitting: Obstetrics

## 2019-01-22 DIAGNOSIS — S161XXA Strain of muscle, fascia and tendon at neck level, initial encounter: Secondary | ICD-10-CM | POA: Insufficient documentation

## 2019-09-14 ENCOUNTER — Ambulatory Visit (INDEPENDENT_AMBULATORY_CARE_PROVIDER_SITE_OTHER): Payer: 59 | Admitting: Medical

## 2019-09-14 ENCOUNTER — Other Ambulatory Visit: Payer: Self-pay

## 2019-09-14 ENCOUNTER — Encounter: Payer: Self-pay | Admitting: Medical

## 2019-09-14 VITALS — BP 108/75 | HR 90 | Wt 125.7 lb

## 2019-09-14 DIAGNOSIS — Z3A13 13 weeks gestation of pregnancy: Secondary | ICD-10-CM

## 2019-09-14 DIAGNOSIS — Z3480 Encounter for supervision of other normal pregnancy, unspecified trimester: Secondary | ICD-10-CM | POA: Insufficient documentation

## 2019-09-14 DIAGNOSIS — N898 Other specified noninflammatory disorders of vagina: Secondary | ICD-10-CM | POA: Diagnosis not present

## 2019-09-14 DIAGNOSIS — Z23 Encounter for immunization: Secondary | ICD-10-CM | POA: Diagnosis not present

## 2019-09-14 DIAGNOSIS — Z3401 Encounter for supervision of normal first pregnancy, first trimester: Secondary | ICD-10-CM

## 2019-09-14 DIAGNOSIS — O219 Vomiting of pregnancy, unspecified: Secondary | ICD-10-CM

## 2019-09-14 DIAGNOSIS — Z113 Encounter for screening for infections with a predominantly sexual mode of transmission: Secondary | ICD-10-CM | POA: Diagnosis not present

## 2019-09-14 DIAGNOSIS — Z3689 Encounter for other specified antenatal screening: Secondary | ICD-10-CM

## 2019-09-14 MED ORDER — METOCLOPRAMIDE HCL 5 MG PO TABS: 5.0000 mg | ORAL_TABLET | Freq: Three times a day (TID) | ORAL | 0 refills | Status: DC | PRN

## 2019-09-14 MED ORDER — BLOOD PRESSURE KIT DEVI: 1.0000 | 0 refills | Status: DC | PRN

## 2019-09-14 NOTE — Progress Notes (Signed)
   PRENATAL VISIT NOTE  Subjective:  Bonnie Gilmore is a 28 y.o. G2P1001 at [redacted]w[redacted]d being seen today for her first prenatal visit for this pregnancy.  She is currently monitored for the following issues for this low-risk pregnancy and has Supervision of other normal pregnancy, antepartum on their problem list.  Patient reports nausea and vomiting.  Contractions: Not present. Vag. Bleeding: None.   . Denies leaking of fluid.   She is planning to breastfeed. Desires Nexplanon for contraception.   The following portions of the patient's history were reviewed and updated as appropriate: allergies, current medications, past family history, past medical history, past social history, past surgical history and problem list.   Objective:   Vitals:   09/14/19 1039  BP: 108/75  Pulse: 90  Weight: 125 lb 11.2 oz (57 kg)    Fetal Status: Fetal Heart Rate (bpm): 168         General:  Alert, oriented and cooperative. Patient is in no acute distress.  Skin: Skin is warm and dry. No rash noted.   Cardiovascular: Normal heart rate and rhythm noted  Respiratory: Normal respiratory effort, no problems with respiration noted. Clear to auscultation.   Abdomen: Soft, gravid, appropriate for gestational age. Normal bowel sounds. Non-tender. Pain/Pressure: Absent     Pelvic: Cervical exam performed Dilation: Closed Effacement (%): Thick   Normal cervical contour, no lesions, no bleeding following pap, normal discharge  Extremities: Normal range of motion.  Edema: None  Mental Status: Normal mood and affect. Normal behavior. Normal judgment and thought content.   Assessment and Plan:  Pregnancy: G2P1001 at [redacted]w[redacted]d 1. Encounter for supervision of normal first pregnancy in first trimester - Cervicovaginal ancillary only( [facility]) - Culture, OB Urine - Genetic Screening - Obstetric Panel, Including HIV - Flu Vaccine QUAD 36+ mos IM (Fluarix, Quad PF)  2. Nausea and vomiting in pregnancy prior to [redacted] weeks  gestation - Has tried Phenergan and notes itching with that medication  - metoCLOPramide (REGLAN) 5 MG tablet; Take 1 tablet (5 mg total) by mouth every 8 (eight) hours as needed for nausea.  Dispense: 30 tablet; Refill: 0  Nature of our practice with multiple providers discussed  Normal cadence of prenatal visits discussed  Patient advised of how/when to take BP at home and to report any values > 140/90   Preterm labor/ first trimester symptoms and general obstetric precautions including but not limited to vaginal bleeding, contractions, leaking of fluid and fetal movement were reviewed in detail with the patient. Please refer to After Visit Summary for other counseling recommendations.   No follow-ups on file.  Future Appointments  Date Time Provider [place]  10/12/2019 10:35 AM Luvenia Redden, PA-C [facility] None    Kerry Hough, PA-C

## 2019-09-14 NOTE — Patient Instructions (Signed)
Safe Medications in Pregnancy   Acne:  Benzoyl Peroxide  Salicylic Acid   Backache/Headache:  Tylenol: 2 regular strength every 4 hours OR        2 Extra strength every 6 hours   Colds/Coughs/Allergies:  Benadryl (alcohol free) 25 mg every 6 hours as needed  Breath right strips  Claritin  Cepacol throat lozenges  Chloraseptic throat spray  Cold-Eeze- up to three times per day  Cough drops, alcohol free  Flonase (by prescription only)  Guaifenesin  Mucinex  Robitussin DM (plain only, alcohol free)  Saline nasal spray/drops  Sudafed (pseudoephedrine) & Actifed * use only after [redacted] weeks gestation and if you do not have high blood pressure  Tylenol  Vicks Vaporub  Zinc lozenges  Zyrtec   Constipation:  Colace  Ducolax suppositories  Fleet enema  Glycerin suppositories  Metamucil  Milk of magnesia  Miralax  Senokot  Smooth move tea   Diarrhea:  Kaopectate  Imodium A-D   *NO pepto Bismol   Hemorrhoids:  Anusol  Anusol HC  Preparation H  Tucks   Indigestion:  Tums  Maalox  Mylanta  Zantac  Pepcid   Insomnia:  Benadryl (alcohol free) 25mg  every 6 hours as needed  Tylenol PM  Unisom, no Gelcaps   Leg Cramps:  Tums  MagGel   Nausea/Vomiting:  Bonine  Dramamine  Emetrol  Ginger extract  Sea bands  Meclizine  Nausea medication to take during pregnancy:  Unisom (doxylamine succinate 25 mg tablets) Take one tablet daily at bedtime. If symptoms are not adequately controlled, the dose can be increased to a maximum recommended dose of two tablets daily (1/2 tablet in the morning, 1/2 tablet mid-afternoon and one at bedtime).  Vitamin B6 100mg  tablets. Take one tablet twice a day (up to 200 mg per day).   Skin Rashes:  Aveeno products  Benadryl cream or 25mg  every 6 hours as needed  Calamine Lotion  1% cortisone cream   Yeast infection:  Gyne-lotrimin 7  Monistat 7    **If taking multiple medications, please check labels to avoid  duplicating the same active ingredients  **take medication as directed on the label  ** Do not exceed 4000 mg of tylenol in 24 hours  **Do not take medications that contain aspirin or ibuprofen          Hyperemesis Gravidarum Hyperemesis gravidarum is a severe form of nausea and vomiting that happens during pregnancy. Hyperemesis is worse than morning sickness. It may cause you to have nausea or vomiting all day for many days. It may keep you from eating and drinking enough food and liquids, which can lead to dehydration, malnutrition, and weight loss. Hyperemesis usually occurs during the first half (the first 20 weeks) of pregnancy. It often goes away once a woman is in her second half of pregnancy. However, sometimes hyperemesis continues through an entire pregnancy. What are the causes? The cause of this condition is not known. It may be related to changes in chemicals (hormones) in the body during pregnancy, such as the high level of pregnancy hormone (human chorionic gonadotropin) or the increase in the female sex hormone (estrogen). What are the signs or symptoms? Symptoms of this condition include:  Nausea that does not go away.  Vomiting that does not allow you to keep any food down.  Weight loss.  Body fluid loss (dehydration).  Having no desire to eat, or not liking food that you have previously enjoyed. How is this diagnosed? This condition may  be diagnosed based on:  A physical exam.  Your medical history.  Your symptoms.  Blood tests.  Urine tests. How is this treated? This condition is managed by controlling symptoms. This may include:  Following an eating plan. This can help lessen nausea and vomiting.  Taking prescription medicines. An eating plan and medicines are often used together to help control symptoms. If medicines do not help relieve nausea and vomiting, you may need to receive fluids through an IV at the hospital. Follow these instructions at  home: Eating and drinking   Avoid the following: ? Drinking fluids with meals. Try not to drink anything during the 30 minutes before and after your meals. ? Drinking more than 1 cup of fluid at a time. ? Eating foods that trigger your symptoms. These may include spicy foods, coffee, high-fat foods, very sweet foods, and acidic foods. ? Skipping meals. Nausea can be more intense on an empty stomach. If you cannot tolerate food, do not force it. Try sucking on ice chips or other frozen items and make up for missed calories later. ? Lying down within 2 hours after eating. ? Being exposed to environmental triggers. These may include food smells, smoky rooms, closed spaces, rooms with strong smells, warm or humid places, overly loud and noisy rooms, and rooms with motion or flickering lights. Try eating meals in a well-ventilated area that is free of strong smells. ? Quick and sudden changes in your movement. ? Taking iron pills and multivitamins that contain iron. If you take prescription iron pills, do not stop taking them unless your health care provider approves. ? Preparing food. The smell of food can spoil your appetite or trigger nausea.  To help relieve your symptoms: ? Listen to your body. Everyone is different and has different preferences. Find what works best for you. ? Eat and drink slowly. ? Eat 5-6 small meals daily instead of 3 large meals. Eating small meals and snacks can help you avoid an empty stomach. ? In the morning, before getting out of bed, eat a couple of crackers to avoid moving around on an empty stomach. ? Try eating starchy foods as these are usually tolerated well. Examples include cereal, toast, bread, potatoes, pasta, rice, and pretzels. ? Include at least 1 serving of protein with your meals and snacks. Protein options include lean meats, poultry, seafood, beans, nuts, nut butters, eggs, cheese, and yogurt. ? Try eating a protein-rich snack before bed. Examples of  a protein-rick snack include cheese and crackers or a peanut butter sandwich made with 1 slice of whole-wheat bread and 1 tsp (5 g) of peanut butter. ? Eat or suck on things that have ginger in them. It may help relieve nausea. Add  tsp ground ginger to hot tea or choose ginger tea. ? Try drinking 100% fruit juice or an electrolyte drink. An electrolyte drink contains sodium, potassium, and chloride. ? Drink fluids that are cold, clear, and carbonated or sour. Examples include lemonade, ginger ale, lemon-lime soda, ice water, and sparkling water. ? Brush your teeth or use a mouth rinse after meals. ? Talk with your health care provider about starting a supplement of vitamin B6. General instructions  Take over-the-counter and prescription medicines only as told by your health care provider.  Follow instructions from your health care provider about eating or drinking restrictions.  Continue to take your prenatal vitamins as told by your health care provider. If you are having trouble taking your prenatal vitamins, talk with  your health care provider about different options.  Keep all follow-up and pre-birth (prenatal) visits as told by your health care provider. This is important. Contact a health care provider if:  You have pain in your abdomen.  You have a severe headache.  You have vision problems.  You are losing weight.  You feel weak or dizzy. Get help right away if:  You cannot drink fluids without vomiting.  You vomit blood.  You have constant nausea and vomiting.  You are very weak.  You faint.  You have a fever and your symptoms suddenly get worse. Summary  Hyperemesis gravidarum is a severe form of nausea and vomiting that happens during pregnancy.  Making some changes to your eating habits may help relieve nausea and vomiting.  This condition may be managed with medicine.  If medicines do not help relieve nausea and vomiting, you may need to receive fluids  through an IV at the hospital. This information is not intended to replace advice given to you by your health care provider. Make sure you discuss any questions you have with your health care provider. Document Released: 11/29/2005 Document Revised: 12/19/2017 Document Reviewed: 07/28/2016 Elsevier Patient Education  2020 [employer].

## 2019-09-14 NOTE — Progress Notes (Signed)
NOB in the office, denies pain. Pt states that when she takes phenergan her skin itches, requests another rx for nausea, provider notified.

## 2019-09-15 LAB — OBSTETRIC PANEL, INCLUDING HIV
Antibody Screen: NEGATIVE
Basophils Absolute: 0 10*3/uL (ref 0.0–0.2)
Basos: 1 %
EOS (ABSOLUTE): 0.2 10*3/uL (ref 0.0–0.4)
Eos: 3 %
HIV Screen 4th Generation wRfx: NONREACTIVE
Hematocrit: 38.9 % (ref 34.0–46.6)
Hemoglobin: 13.5 g/dL (ref 11.1–15.9)
Hepatitis B Surface Ag: NEGATIVE
Immature Grans (Abs): 0 10*3/uL (ref 0.0–0.1)
Immature Granulocytes: 0 %
Lymphocytes Absolute: 1.4 10*3/uL (ref 0.7–3.1)
Lymphs: 19 %
MCH: 30.2 pg (ref 26.6–33.0)
MCHC: 34.7 g/dL (ref 31.5–35.7)
MCV: 87 fL (ref 79–97)
Monocytes Absolute: 0.4 10*3/uL (ref 0.1–0.9)
Monocytes: 5 %
Neutrophils Absolute: 5.4 10*3/uL (ref 1.4–7.0)
Neutrophils: 72 %
Platelets: 201 10*3/uL (ref 150–450)
RBC: 4.47 x10E6/uL (ref 3.77–5.28)
RDW: 12.6 % (ref 11.7–15.4)
RPR Ser Ql: NONREACTIVE
Rh Factor: POSITIVE
Rubella Antibodies, IGG: 7.62 index (ref >0.99)
WBC: 7.5 10*3/uL (ref 3.4–10.8)

## 2019-09-16 LAB — CULTURE, OB URINE

## 2019-09-16 LAB — URINE CULTURE, OB REFLEX: Organism ID, Bacteria: NO GROWTH

## 2019-09-17 LAB — CERVICOVAGINAL ANCILLARY ONLY
Bacterial Vaginitis (gardnerella): NEGATIVE
Candida Glabrata: NEGATIVE
Candida Vaginitis: NEGATIVE
Chlamydia: NEGATIVE
Neisseria Gonorrhea: NEGATIVE
Trichomonas: NEGATIVE

## 2019-09-24 ENCOUNTER — Encounter: Payer: Self-pay | Admitting: Obstetrics and Gynecology

## 2019-09-24 ENCOUNTER — Encounter: Payer: Self-pay | Admitting: Obstetrics

## 2019-09-25 DIAGNOSIS — Z3481 Encounter for supervision of other normal pregnancy, first trimester: Secondary | ICD-10-CM

## 2019-10-12 ENCOUNTER — Ambulatory Visit (INDEPENDENT_AMBULATORY_CARE_PROVIDER_SITE_OTHER): Payer: 59 | Admitting: Family Medicine

## 2019-10-12 ENCOUNTER — Other Ambulatory Visit: Payer: Self-pay

## 2019-10-12 VITALS — BP 114/72 | HR 89 | Wt 123.5 lb

## 2019-10-12 DIAGNOSIS — Z3480 Encounter for supervision of other normal pregnancy, unspecified trimester: Secondary | ICD-10-CM

## 2019-10-12 DIAGNOSIS — Z789 Other specified health status: Secondary | ICD-10-CM | POA: Insufficient documentation

## 2019-10-12 DIAGNOSIS — Z3482 Encounter for supervision of other normal pregnancy, second trimester: Secondary | ICD-10-CM

## 2019-10-12 DIAGNOSIS — Z3A17 17 weeks gestation of pregnancy: Secondary | ICD-10-CM

## 2019-10-12 NOTE — Progress Notes (Signed)
Subjective:  Bonnie Gilmore is a 28 y.o. G2P1001 at [redacted]w[redacted]d being seen today for ongoing prenatal care.  She is currently monitored for the following issues for this low-risk pregnancy and has Supervision of other normal pregnancy, antepartum and Language barrier on their problem list.  Patient reports no complaints. Denies leaking of fluid.   The following portions of the patient's history were reviewed and updated as appropriate: allergies, current medications, past family history, past medical history, past social history, past surgical history and problem list. Problem list updated.  Objective:   Vitals:   10/12/19 1041  BP: 114/72  Pulse: 89  Weight: 123 lb 8 oz (56 kg)    Fetal Status: Fetal Heart Rate (bpm): 161 Fundal Height: 16 cm       General:  Alert, oriented and cooperative. Patient is in no acute distress.  Skin: Skin is warm and dry. No rash noted.   Cardiovascular: Normal heart rate noted  Respiratory: Normal respiratory effort, no problems with respiration noted  Abdomen: Soft, gravid, appropriate for gestational age.       Pelvic:       Cervical exam deferred        Extremities: Normal range of motion.  Edema: None  Mental Status: Normal mood and affect. Normal behavior. Normal judgment and thought content.    Assessment and Plan:  Pregnancy: G2P1001 at [redacted]w[redacted]d  1. Supervision of other normal pregnancy, antepartum - Continue routine prenatal care - Anatomy scan scheduled  2. Language barrier - [place] interpreter ([employer]) used for this encounter  Preterm labor symptoms and general obstetric precautions including but not limited to vaginal bleeding, contractions, leaking of fluid and fetal movement were reviewed in detail with the patient. Please refer to After Visit Summary for other counseling recommendations.  Return in about 4 weeks (around 11/09/2019) for ROB.   Phat Dalton L, DO

## 2019-10-12 NOTE — Patient Instructions (Signed)
Breastfeeding and Self-Care It is normal to have some problems when you start to breastfeed your new baby. But there are things that you can do to take care of yourself and help prevent many common problems. This includes keeping your breasts healthy and making sure that your baby's mouth attaches (latches) properly to your nipple for feedings. Work with your doctor or breastfeeding specialist (lactation consultant) to find what works best for you. Follow these instructions at home: Breastfeeding strategy   Always make sure that your baby latches properly to breastfeed.  Make sure that your baby is in a proper position. Try different breastfeeding positions to find one that works best for you and your baby.  Breastfeed when you feel like you need to make your breasts less full or when your baby shows signs of hunger. This is called "breastfeeding on demand."  Do not delay feedings.  Try to relax when it is time to feed your baby. This helps your body release milk from your breast.  To help increase milk flow: ? Remove a small amount of milk from your breast right before breastfeeding. Do this using a pump or by squeezing with your hand. ? Apply warm, moist heat to your breast right before feeding. You can do this in the shower or with hand towels soaked with warm water. ? Massage your breast right before or during feeding. Breast care   To help your breasts stay healthy and keep them from getting too dry: ? Avoid using soap on your nipples. ? Let your nipples air-dry for 3-4 minutes after each feeding. ? Use only cotton bra pads to soak up breast milk that leaks. Be sure to change the pads if they become soaked with milk. If you use bra pads that can be thrown away, change them often. ? Put some lanolin on your nipples after breastfeeding. Pure lanolin does not need to be washed off your nipple before you feed your baby again. Pure lanolin is not harmful to your baby. ? Rub some breast  milk into your nipples: ? Use your hand to squeeze out a few drops of breast milk. ? Gently massage the milk into your nipples. ? Let your nipples air-dry.  Wear a supportive nursing bra. Avoid wearing: ? Tight clothing. ? Underwire bras or bras that put pressure on your breasts.  Use ice to help relieve pain or swelling of your breasts: ? Put ice in a plastic bag. ? Place a towel between your skin and the bag. ? Leave the ice on for 20 minutes, 2-3 times a day. General instructions  Drink enough fluid to keep your pee (urine) pale yellow.  Get plenty of rest. Sleep when your baby sleeps.  Talk to your doctor or breastfeeding specialist before taking any herbal supplements. Contact a health care provider if:  You have nipple pain.  You have cracking or soreness in your nipples that lasts longer than 1 week.  Your breasts are overfilled with milk (engorgement) and this lasts longer than 48 hours.  You have a fever.  You have pus-like fluid coming from your nipple.  You have redness, a rash, swelling, itching, or burning on your breast.  Your baby does not gain weight.  Your baby loses weight. Summary  There are things that you can do to take care of yourself and help prevent many common breastfeeding problems.  Always make sure that your baby's mouth attaches (latches) to your nipple properly to breastfeed.  Keep your nipples   from getting too dry, drink plenty of fluid, and get plenty of rest.  Feed on demand. Do not delay feedings. This information is not intended to replace advice given to you by your health care provider. Make sure you discuss any questions you have with your health care provider. Document Released: 07/06/2017 Document Revised: 03/21/2019 Document Reviewed: 07/06/2017 Elsevier Patient Education  2020 Elsevier Inc.  

## 2019-10-15 ENCOUNTER — Telehealth: Payer: Self-pay | Admitting: Family Medicine

## 2019-10-15 MED ORDER — ONDANSETRON 4 MG PO TBDP: 4.0000 mg | ORAL_TABLET | Freq: Three times a day (TID) | ORAL | 0 refills | Status: DC | PRN

## 2019-10-15 NOTE — Telephone Encounter (Signed)
Sent Zofran to pharmacy for NV

## 2019-10-26 ENCOUNTER — Ambulatory Visit (HOSPITAL_COMMUNITY)
Admission: RE | Admit: 2019-10-26 | Discharge: 2019-10-26 | Disposition: A | Discharge status: Home / Self Care | Payer: 59 | Source: Ambulatory Visit | Attending: Medical | Admitting: Medical

## 2019-10-26 ENCOUNTER — Other Ambulatory Visit (HOSPITAL_COMMUNITY): Payer: Self-pay | Admitting: *Deleted

## 2019-10-26 ENCOUNTER — Other Ambulatory Visit: Payer: Self-pay

## 2019-10-26 DIAGNOSIS — Z363 Encounter for antenatal screening for malformations: Secondary | ICD-10-CM

## 2019-10-26 DIAGNOSIS — Z3A17 17 weeks gestation of pregnancy: Secondary | ICD-10-CM

## 2019-10-26 DIAGNOSIS — O219 Vomiting of pregnancy, unspecified: Secondary | ICD-10-CM

## 2019-10-26 DIAGNOSIS — Z362 Encounter for other antenatal screening follow-up: Secondary | ICD-10-CM

## 2019-11-13 ENCOUNTER — Ambulatory Visit (INDEPENDENT_AMBULATORY_CARE_PROVIDER_SITE_OTHER): Payer: 59 | Admitting: Obstetrics and Gynecology

## 2019-11-13 ENCOUNTER — Encounter: Payer: Self-pay | Admitting: Obstetrics and Gynecology

## 2019-11-13 VITALS — BP 100/66 | HR 88 | Wt 128.0 lb

## 2019-11-13 DIAGNOSIS — Z3480 Encounter for supervision of other normal pregnancy, unspecified trimester: Secondary | ICD-10-CM

## 2019-11-13 DIAGNOSIS — Z3482 Encounter for supervision of other normal pregnancy, second trimester: Secondary | ICD-10-CM

## 2019-11-13 DIAGNOSIS — Z789 Other specified health status: Secondary | ICD-10-CM

## 2019-11-13 DIAGNOSIS — Z3A21 21 weeks gestation of pregnancy: Secondary | ICD-10-CM

## 2019-11-13 NOTE — Progress Notes (Signed)
Virtual Visit via Telephone Note  I connected with Myria Steen[place]gen [place] on 11/13/19 at  9:15 AM EST by telephone and verified that I am speaking with the correct person using two identifiers.  Used [place] translator [phone] No concerns today per pt

## 2019-11-13 NOTE — Progress Notes (Signed)
   TELEHEALTH VIRTUAL OBSTETRICS VISIT ENCOUNTER NOTE  I connected with Bonnie Gilmore on 11/13/19 at  9:15 AM EST by telephone at home and verified that I am speaking with the correct person using two identifiers.    I discussed the limitations, risks, security and privacy concerns of performing an evaluation and management service by telephone and the availability of in person appointments. I also discussed with the patient that there may be a patient responsible charge related to this service. The patient expressed understanding and agreed to proceed.  Subjective:  Bonnie Gilmore is a 28 y.o. G2P1001 at [redacted]w[redacted]d being followed for ongoing prenatal care.  She is currently monitored for the following issues for this low-risk pregnancy and has Supervision of other normal pregnancy, antepartum and Language barrier on their problem list.  Patient reports no complaints. Reports fetal movement. Denies any contractions, bleeding or leaking of fluid.   The following portions of the patient's history were reviewed and updated as appropriate: allergies, current medications, past family history, past medical history, past social history, past surgical history and problem list.   Objective:   General:  Alert, oriented and cooperative.   Mental Status: Normal mood and affect perceived. Normal judgment and thought content.  Rest of physical exam deferred due to type of encounter  BP 100/66   Pulse 88   Wt 128 lb (58.1 kg)   LMP 06/15/2019   BMI 21.97 kg/m  **Done by patient's own at home BP cuff and scale  Assessment and Plan:  Pregnancy: G2P1001 at [redacted]w[redacted]d  1. Supervision of other normal pregnancy, antepartum - Discussed optimized OB schedule -- virtual visit in 4 wks with interpreter  2. Language barrier - Interpreter Thuy ID#: [id number] was used for entire call.   Preterm labor symptoms and general obstetric precautions including but not limited to vaginal bleeding, contractions, leaking of fluid and  fetal movement were reviewed in detail with the patient.  I discussed the assessment and treatment plan with the patient. The patient was provided an opportunity to ask questions and all were answered. The patient agreed with the plan and demonstrated an understanding of the instructions. The patient was advised to call back or seek an in-person office evaluation/go to [facility]U at [facility] for any urgent or concerning symptoms. Please refer to After Visit Summary for other counseling recommendations.   I provided 5 minutes of non-face-to-face time during this encounter. There was 5 minutes of chart review time spent prior to this encounter. Total time spent = 10 minutes.  Return in about 4 weeks (around 12/11/2019) for Return OB - My Chart video.  Future Appointments  Date Time Provider [place]  11/26/2019  8:45 AM [facility] [place] 2 [facility] M[facility]-US    Lavi Sheehan, [place] for [employer], [place]

## 2019-11-26 ENCOUNTER — Encounter (HOSPITAL_COMMUNITY): Payer: Self-pay

## 2019-11-26 ENCOUNTER — Other Ambulatory Visit: Payer: Self-pay

## 2019-11-26 ENCOUNTER — Ambulatory Visit (HOSPITAL_COMMUNITY)
Admission: RE | Admit: 2019-11-26 | Discharge: 2019-11-26 | Disposition: A | Discharge status: Home / Self Care | Payer: 59 | Source: Ambulatory Visit | Attending: Obstetrics and Gynecology | Admitting: Obstetrics and Gynecology

## 2019-11-26 ENCOUNTER — Other Ambulatory Visit (HOSPITAL_COMMUNITY): Payer: Self-pay | Admitting: *Deleted

## 2019-11-26 DIAGNOSIS — O4443 Low lying placenta NOS or without hemorrhage, third trimester: Secondary | ICD-10-CM

## 2019-11-26 DIAGNOSIS — O4442 Low lying placenta NOS or without hemorrhage, second trimester: Secondary | ICD-10-CM

## 2019-11-26 DIAGNOSIS — Z362 Encounter for other antenatal screening follow-up: Secondary | ICD-10-CM | POA: Diagnosis not present

## 2019-11-26 DIAGNOSIS — Z3A22 22 weeks gestation of pregnancy: Secondary | ICD-10-CM

## 2019-12-11 ENCOUNTER — Other Ambulatory Visit: Payer: Self-pay

## 2019-12-11 ENCOUNTER — Encounter: Payer: Self-pay | Admitting: Medical

## 2019-12-11 ENCOUNTER — Encounter: Payer: 59 | Admitting: Medical

## 2019-12-11 VITALS — BP 101/71 | HR 93

## 2019-12-11 DIAGNOSIS — Z3480 Encounter for supervision of other normal pregnancy, unspecified trimester: Secondary | ICD-10-CM

## 2019-12-11 DIAGNOSIS — Z789 Other specified health status: Secondary | ICD-10-CM

## 2019-12-11 DIAGNOSIS — Z20822 Contact with and (suspected) exposure to covid-19: Secondary | ICD-10-CM

## 2019-12-11 NOTE — Progress Notes (Signed)
No answer

## 2019-12-11 NOTE — Progress Notes (Signed)
I connected with Bonnie Gilmore on 12/11/19 at  9:15 AM EST by: MyChart and verified that I am speaking with the correct person using two identifiers.  Patient is located at home and provider is located at [facility].     The purpose of this virtual visit is to provide medical care while limiting exposure to the novel coronavirus. I discussed the limitations, risks, security and privacy concerns of performing an evaluation and management service by MyChart and the availability of in person appointments. I also discussed with the patient that there may be a patient responsible charge related to this service. By engaging in this virtual visit, you consent to the provision of healthcare.  Additionally, you authorize for your insurance to be billed for the services provided during this visit.  The patient expressed understanding and agreed to proceed.  The following staff members participated in the virtual visit:  Jasmine Awe, [place] VISIT NOTE  Subjective:  Bonnie Gilmore is a 28 y.o. G2P1001 at [redacted]w[redacted]d  for phone visit for ongoing prenatal care.  She is currently monitored for the following issues for this low-risk pregnancy and has Supervision of other normal pregnancy, antepartum and Language barrier on their problem list.  Patient reports baby is moving too much.  Contractions: Not present. Vag. Bleeding: None.  Movement: Present. Denies leaking of fluid.   The following portions of the patient's history were reviewed and updated as appropriate: allergies, current medications, past family history, past medical history, past social history, past surgical history and problem list.   Objective:   Vitals:   12/11/19 0951  BP: 101/71  Pulse: 93   Self-Obtained  Fetal Status:     Movement: Present     Assessment and Plan:  Pregnancy: G2P1001 at [redacted]w[redacted]d 1. Supervision of other normal pregnancy, antepartum - Doing well - +FM, only occasional abdominal pains, no contractions, VB or LOF  -  Repeat US for growth and placenta scheduled 01/07/20 - low lying placenta noted on last [place]  - Discussed need for GTT at next visit in the office   2. Language barrier  3. Close exposure to COVID-19 virus - Child's babysitter tested positive. Whole family is going for outpatient testing today - Advised to quarantine until results come back and if positive quarantine x 14 days  - Advised that if she has an emergent need to go to MAU during quarantine to call ahead for safety precautions    Preterm labor symptoms and general obstetric precautions including but not limited to vaginal bleeding, contractions, leaking of fluid and fetal movement were reviewed in detail with the patient.  Return for LOB, In-Person.  Future Appointments  Date Time Provider [place]  01/07/2020  8:45 AM [place] [facility]  01/07/2020  8:45 AM [place] 2 [facility]-US     Time spent on virtual visit: 10 minutes  Kerry Hough, PA-C

## 2019-12-11 NOTE — Patient Instructions (Signed)

## 2019-12-11 NOTE — Addendum Note (Signed)
Addended by: Luvenia Redden on: 12/11/2019 10:33 AM   Modules accepted: Level of Service

## 2019-12-11 NOTE — Progress Notes (Signed)
I connected with  Bonnie Gilmore on 12/11/19 by a video enabled telemedicine application and verified that I am speaking with the correct person using two identifiers.   I discussed the limitations of evaluation and management by telemedicine. The patient expressed understanding and agreed to proceed.  ROB. Reports no problems today.

## 2019-12-14 NOTE — L&D Delivery Note (Signed)
OB/GYN Faculty Practice Delivery Note  Bonnie Gilmore is a 29 y.o. G2P1001 s/p vag del at [redacted]w[redacted]d. She was admitted for spont onset of latent labor.   ROM: 2h 44m with clear fluid GBS Status: pos Maximum Maternal Temperature: 99  Labor Progress: Marland Kitchen Ms [place] was admitted in early active labor. She received PCN x 2 doses prior to delivery. She had SROM at a mostly unknown time during the early morning, but the fluid was too small to be detected until later when no membranes were palpated, at which point more amniotic fluid was seen. She required a small amt of Pitocin and pushed just under an hour to vag del.  Delivery Date/Time: March 21, 2020 at 1155a Delivery: Called to room and patient was complete and pushing. Head delivered LOA. No nuchal cord present. Shoulder and body delivered in usual fashion. Infant with spontaneous cry, placed on mother's abdomen, dried and stimulated. Cord clamped x 2 after 1-minute delay, and cut by FOB. Cord blood drawn. Placenta delivered spontaneously with gentle cord traction. Fundus firm with massage and Pitocin. Labia, perineum, vagina, and cervix inspected and was intact.   Placenta: spont, intact Complications: none Lacerations: none EBL: 503cc Analgesia: epidural  Postpartum Planning [x]  message to sent to schedule follow-up    Infant: female  APGARs 8/9  3391gm (7lb 7.6oz)  HOA BRIGGS, CNM  03/21/2020 12:40 PM

## 2020-01-07 ENCOUNTER — Other Ambulatory Visit: Payer: Self-pay

## 2020-01-07 ENCOUNTER — Ambulatory Visit (HOSPITAL_COMMUNITY): Payer: 59 | Admitting: *Deleted

## 2020-01-07 ENCOUNTER — Encounter (HOSPITAL_COMMUNITY): Payer: Self-pay

## 2020-01-07 ENCOUNTER — Ambulatory Visit (HOSPITAL_COMMUNITY)
Admission: RE | Admit: 2020-01-07 | Discharge: 2020-01-07 | Disposition: A | Discharge status: Home / Self Care | Payer: 59 | Source: Ambulatory Visit | Attending: Obstetrics and Gynecology | Admitting: Obstetrics and Gynecology

## 2020-01-07 VITALS — BP 109/71 | HR 88 | Temp 97.9°F

## 2020-01-07 DIAGNOSIS — O4443 Low lying placenta NOS or without hemorrhage, third trimester: Secondary | ICD-10-CM

## 2020-01-07 DIAGNOSIS — Z3A28 28 weeks gestation of pregnancy: Secondary | ICD-10-CM

## 2020-01-08 ENCOUNTER — Ambulatory Visit (INDEPENDENT_AMBULATORY_CARE_PROVIDER_SITE_OTHER): Payer: 59 | Admitting: Family Medicine

## 2020-01-08 ENCOUNTER — Other Ambulatory Visit: Payer: 59

## 2020-01-08 ENCOUNTER — Encounter: Payer: Self-pay | Admitting: Obstetrics

## 2020-01-08 ENCOUNTER — Encounter: Payer: Self-pay | Admitting: Family Medicine

## 2020-01-08 ENCOUNTER — Other Ambulatory Visit: Payer: Self-pay

## 2020-01-08 VITALS — BP 104/67 | HR 82 | Wt 144.6 lb

## 2020-01-08 DIAGNOSIS — Z23 Encounter for immunization: Secondary | ICD-10-CM | POA: Diagnosis not present

## 2020-01-08 DIAGNOSIS — Z3480 Encounter for supervision of other normal pregnancy, unspecified trimester: Secondary | ICD-10-CM

## 2020-01-08 DIAGNOSIS — Z789 Other specified health status: Secondary | ICD-10-CM

## 2020-01-08 DIAGNOSIS — Z3A29 29 weeks gestation of pregnancy: Secondary | ICD-10-CM

## 2020-01-08 DIAGNOSIS — Z3483 Encounter for supervision of other normal pregnancy, third trimester: Secondary | ICD-10-CM

## 2020-01-08 NOTE — Patient Instructions (Signed)
Xt nghi?m dung n?p glucose trong thai k? Glucose Tolerance Test During Pregnancy T?i sao ti ph?i lm xt nghi?m ny? Xt nghi?m dung n?p glucose (GTT) ???c th?c hi?n ?? ki?m tra xem c? th? qu v? x? l ???ng (glucose) nh? th? no. ?y l m?t trong vi xt nghi?m ???c dng ?? ch?n ?on b?nh ti?u ???ng pht tri?n trong th?i k? mang thai (b?nh ti?u ???ng thai k?). Ti?u ????ng thai k? la? m?t d?ng ti?u ????ng nh?t th??i ma? m?t s? phu? n?? m??c pha?i trong lu?c mang thai. B?nh th??ng x?y ra trong ba thng mang thai th? hai c?a thai k? v kh?i sau khi sinh con. Xt nghi?m (sng l?c) b?nh ti?u ???ng thai k? th??ng x?y ra trong kho?ng t? 24 ??n 28 tu?n c?a thai k?. Qu v? c th? ???c lm xt nghi?m GTT (xt nghi?m dung n?p glucose) sau khi lm xt nghi?m sng l?c glucose 1 gi? n?u k?t qu? t? xt nghi?m ? cho th?y r?ng qu v? c kh? n?ng b? ti?u ???ng thai k?. Qu v? c?ng c th? ???c lm xt nghi?m ny n?u:  Qu v? c ti?n s? b? ti?u ???ng thai k?.  Qu v? c ti?n s? sinh con qu l?n ho?c thai b? ch?t (thai ch?t l?u) l?p ?i l?p l?i.  Qu v? c cc d?u hi?u ho?c tri?u ch?ng c?a ti?u ???ng, ch?ng h?n nh?: ? Th? l?c c?a qu v? thay ??i. ? ?au bu?t ho?c t ? bn tay ho?c bn chn. ? Thay ??i c?m gic ?i, kht n??c v ti?u ti?n m khng gi?i thch ???c l do mang thai. Nh?ng g s? ???c xt nghi?m? Xt nghi?m ny ?o l??ng glucose trong mu ? cc th?i ?i?m khc nhau trong kho?ng th?i gian l 3 gi?. Xt nghi?m ny cho bi?t c? th? qu v? c kh? n?ng x? l glucose nh? th? no. Lo?i m?u no ???c l?y?  C?n l?y cc m?u ma?u cho xe?t nghi?m na?y. Cc m?u ? th??ng ???c l?y b?ng cch lu?n m?t kim tim vo m?ch mu. Ti c?n chu?n b? cho xt nghi?m ny nh? th? no?  Trong 3 ngy tr??c khi xt nghi?m, hy ?n u?ng bnh th??ng. ?n nhi?u th?c ph?m giu carbohydrate.  Tun th? theo ch? d?n c?a chuyn gia ch?m sc s?c kh?e v?: ? H?n ch? ?n ho?c u?ng vo ngy lm xt nghi?m. Qu v? c th? ???c yu c?u khng ?n  ho?c u?ng b?t c? th? g ngoi n??c (nh?n ?i) b?t ??u 8-10 gi? tr??c khi xt nghi?m. ? Thay ??i ho?c d?ng s? d?ng cc lo?i thu?c th??ng xuyn dng c?a qu v?. M?t s? thu?c c th? gy ?nh h??ng ln xt nghi?m ny. Hy cho chuyn gia ch?m sc s?c kh?e bi?t v?:  T?t c? cc lo?i thu?c m qu v? ?ang s? d?ng, bao g?m c? vitamin, th?o d??c, thu?c nh? m?t, thu?c d?ng kem v thu?c khng k ??n.  B?t k? b?nh l v? mu no m qu v? c.  B?t k? ph?u thu?t no qu v? ? c.  B?t k? tnh tr?ng b?nh l no qu v? c. ?i?u g x?y ra trong qu trnh xt nghi?m? Tr??c tin, qu v? s? ???c ?o ???ng huy?t. Ch? s? ny ???c g?i l ???ng huy?t lc ?i, v qu v? ? nh?n ?i tr??c xt nghi?m. Sau ?, qu v? s? u?ng m?t dung d?ch glucose c ch?a m?t l??ng glucose nh?t ??nh. ???ng huy?t c?a qu v? s? ???c ?o l?i 1,   2 v 3 gi? sau khi u?ng dung d?ch ny. Xt nghi?m ny m?t kho?ng 3 gi? ?? hon thnh. Qu v? s? c?n ? l?i n?i xt nghi?m trong th?i gian ny. Trong th?i gian lm xt nghi?m:  Khng ?n ho?c u?ng b?t k? th? g ngoi dung d?ch glucose.  Khng t?p th? d?c.  Khng s? d?ng b?t k? s?n ph?m no c nicotine ho?c thu?c l, ch?ng Emmani?n nh? thu?c l d?ng ht v thu?c l ?i?n t?. N?u qu v? c?n gip ?? ?? cai thu?c, hy h?i chuyn gia ch?m sc s?c kh?e. Quy trnh xt nghi?m ny c th? khc nhau gi?a cc chuyn gia ch?m sc s?c kh?e v cc b?nh vi?n. K?t qu? ???c thng bo nh? th? no? Cc k?t qu? c?a qu v? s? ???c bo co theo miligram glucose trn decilit mu (mg/dL) ho?c milimol trn lt (mmol/L). Chuyn gia ch?m sc s?c kh?e s? so snh k?t qu? c?a qu v? v?i ph?m vi bnh th??ng ? ???c thi?t l?p sau khi xt nghi?m m?t nhm nhi?u ng??i (ph?m vi tham chi?u). Ph?m vi tham chi?u c th? kha?c nhau gi?a cc phng xt nghi?m v b?nh vi?n kha?c nhau. ??i v?i xt nghi?m ny, ph?m vi tham chi?u th??ng g?p l:  Lc ?i: d??i 95-105 mg/dL (5,3-5,8 mmol/L).  1 gi? sau khi u?ng glucose: d??i 180-190 mg/dL (10,0-10,5  mmol/L).  2 gi? sau khi u?ng glucose: d??i 155-165 mg/dL (8,6-9,2 mmol/L).  3 gi? sau khi u?ng glucose: 140-145 mg/dL (7,8-8,1 mmol/L). Cc k?t qu? c  ngh?a g? K?t qu? n?m trong ph?m vi tham chi?u ???c xem l bnh th??ng, ngh?a l m?c glucose c?a qu v? ???c ki?m sot t?t. N?u hai hay nhi?u m?c ???ng huy?t c?a qu v? cao, qu v? c th? ???c ch?n ?on b? ti?u ???ng thai k?. N?u ch? c m?t m?c cao, chuyn gia ch?m sc s?c kh?e c th? g?i  lm l?i xt nghi?m ho?c lm cc xt nghi?m khc ?? xc ??nh ch?n ?on. Hy trao ??i v?i chuyn gia ch?m sc s?c kh?e v?  ngh?a cc k?t qu? c?a qu v?. Ca?c cu h?i ??t ra v?i chuyn gia ch?m sc s?c kh?e c?a qu v? Hy h?i chuyn gia ch?m sc s?c kh?e ho?c khoa th?c hi?n xt nghi?m:  Khi no s? c k?t qu? c?a ti?  Ti s? nh?n k?t qu? b?ng cch no?  Cc ph??ng n ?i?u tr? c?a ti l g?  Ti c?n lm cc xt nghi?m no khc?  Nh?ng b??c ti?p theo c?a ti l g? Tm t?t  Xt nghi?m dung n?p glucose (GTT) l m?t trong vi xt nghi?m ???c dng ?? ch?n ?on b?nh ti?u ???ng pht sinh trong th?i k? mang thai (b?nh ti?u ???ng thai k?). Ti?u ????ng thai k? la? m?t d?ng ti?u ????ng nh?t th??i ma? m?t s? phu? n?? m??c pha?i trong lu?c mang thai.  Qu v? c th? ???c lm xt nghi?m GTT sau khi lm xt nghi?m sng l?c glucose 1 gi? n?u k?t qu? t? xt nghi?m ? cho th?y r?ng qu v? c kh? n?ng b? ti?u ???ng thai k?. Qu v? c?ng c th? ???c lm xt nghi?m ny n?u qu v? c b?t k? tri?u ch?ng ho?c y?u t? nguy c? no c?a b?nh ti?u ???ng thai k?.  Hy trao ??i v?i chuyn gia ch?m sc s?c kh?e v?  ngh?a cc k?t qu? c?a qu v?. Thng tin ny khng nh?m m?c ?ch thay th? cho l?i khuyn m chuyn   gia ch?m sc s?c kh?e ni v?i qu v?. Hy b?o ??m qu v? ph?i th?o lu?n b?t k? v?n ?? g m qu v? c v?i chuyn gia ch?m sc s?c kh?e c?a qu v?. Document Revised: 09/10/2019 Document Reviewed: 09/27/2017 Elsevier Patient Education  2020 Elsevier Inc.   

## 2020-01-08 NOTE — Progress Notes (Signed)
Subjective:  Bonnie Gilmore is a 29 y.o. G2P1001 at [redacted]w[redacted]d being seen today for ongoing prenatal care.  She is currently monitored for the following issues for this low-risk pregnancy and has Supervision of other normal pregnancy, antepartum and Language barrier on their problem list.  Patient reports no complaints.  Contractions: Not present. Vag. Bleeding: None.  Movement: Present. Denies leaking of fluid.   The following portions of the patient's history were reviewed and updated as appropriate: allergies, current medications, past family history, past medical history, past social history, past surgical history and problem list. Problem list updated.  Objective:   Vitals:   01/08/20 0930  BP: 104/67  Pulse: 82  Weight: 144 lb 9.6 oz (65.6 kg)    Fetal Status: Fetal Heart Rate (bpm): 150 Fundal Height: 28 cm Movement: Present     General:  Alert, oriented and cooperative. Patient is in no acute distress.  Skin: Skin is warm and dry. No rash noted.   Cardiovascular: Normal heart rate noted  Respiratory: Normal respiratory effort, no problems with respiration noted  Abdomen: Soft, gravid, appropriate for gestational age. Pain/Pressure: Absent     Pelvic: Vag. Bleeding: None     Cervical exam deferred        Extremities: Normal range of motion.  Edema: Trace  Mental Status: Normal mood and affect. Normal behavior. Normal judgment and thought content.    Assessment and Plan:  Pregnancy: G2P1001 at [redacted]w[redacted]d  1. Supervision of other normal pregnancy, antepartum - Continue routine prenatal care, 2 hour GTT today - Glucose Tolerance, 2 Hours w/1 Hour - CBC - RPR - HIV Antibody (routine testing w rflx)  2. Language barrier - [place] interpreter used for this encounter  Preterm labor symptoms and general obstetric precautions including but not limited to vaginal bleeding, contractions, leaking of fluid and fetal movement were reviewed in detail with the patient. Please refer to After  Visit Summary for other counseling recommendations.  Return for 2 weeks, ROB, 32 week labs.   Dyamond Tolosa L, DO

## 2020-01-08 NOTE — Progress Notes (Signed)
Pt is here for ROB and 2 hr GTT. [redacted]w[redacted]d.

## 2020-01-09 LAB — CBC
Hematocrit: 38 % (ref 34.0–46.6)
Hemoglobin: 12.8 g/dL (ref 11.1–15.9)
MCH: 30.9 pg (ref 26.6–33.0)
MCHC: 33.7 g/dL (ref 31.5–35.7)
MCV: 92 fL (ref 79–97)
Platelets: 193 10*3/uL (ref 150–450)
RBC: 4.14 x10E6/uL (ref 3.77–5.28)
RDW: 12.2 % (ref 11.7–15.4)
WBC: 9.2 10*3/uL (ref 3.4–10.8)

## 2020-01-09 LAB — GLUCOSE TOLERANCE, 2 HOURS W/ 1HR
Glucose, 1 hour: 76 mg/dL (ref 65–179)
Glucose, 2 hour: 89 mg/dL (ref 65–152)
Glucose, Fasting: 66 mg/dL (ref 65–91)

## 2020-01-09 LAB — RPR: RPR Ser Ql: NONREACTIVE

## 2020-01-09 LAB — HIV ANTIBODY (ROUTINE TESTING W REFLEX): HIV Screen 4th Generation wRfx: NONREACTIVE

## 2020-01-22 ENCOUNTER — Ambulatory Visit (INDEPENDENT_AMBULATORY_CARE_PROVIDER_SITE_OTHER): Payer: Medicaid Other | Admitting: Women's Health

## 2020-01-22 ENCOUNTER — Encounter: Payer: Self-pay | Admitting: Obstetrics

## 2020-01-22 ENCOUNTER — Other Ambulatory Visit: Payer: Self-pay

## 2020-01-22 ENCOUNTER — Encounter: Payer: Self-pay | Admitting: Women's Health

## 2020-01-22 VITALS — BP 106/79 | HR 91 | Wt 147.6 lb

## 2020-01-22 DIAGNOSIS — Z3483 Encounter for supervision of other normal pregnancy, third trimester: Secondary | ICD-10-CM

## 2020-01-22 DIAGNOSIS — Z603 Acculturation difficulty: Secondary | ICD-10-CM

## 2020-01-22 DIAGNOSIS — Z3480 Encounter for supervision of other normal pregnancy, unspecified trimester: Secondary | ICD-10-CM

## 2020-01-22 DIAGNOSIS — Z3A31 31 weeks gestation of pregnancy: Secondary | ICD-10-CM

## 2020-01-22 DIAGNOSIS — Z789 Other specified health status: Secondary | ICD-10-CM

## 2020-01-22 NOTE — Patient Instructions (Signed)
Places to have your son circumcised:                                                                      [facility]     [phone] while you are in hospital         [facility]              [phone]   $269 by 4 wks                      [facility]                     [phone]   $269 by 7 days [facility]                    [phone]   $269 by 4 wks [facility]             [phone]   $225 by 2 wks    These prices sometimes change but are roughly what you can expect to pay. Please call and confirm pricing.   Circumcision is considered an elective/non-medically necessary procedure. There are many reasons parents decide to have their sons circumsized. During the first year of life circumcised males have a reduced risk of urinary tract infections but after this year the rates between circumcised males and uncircumcised males are the same.  It is safe to have your son circumcised outside of the hospital and the places above perform them regularly.   Deciding about Circumcision in Baby Boys  (Up-to-date The Basics)  What is circumcision?   Circumcision is a surgery that removes the skin that covers the tip of the penis, called the "foreskin" Circumcision is usually done when a boy is between 75 and 66 days old. In the [place], circumcision is common. In some other countries, fewer boys are circumcised. Circumcision is a common tradition in some religions.  Should I have my baby boy circumcised?   There is no easy answer. Circumcision has some benefits. But it also has risks. After talking with your doctor, you will have to decide for yourself what is right for your family.  What are the benefits of circumcision?   Circumcised boys seem to have slightly lower rates of: ?Urinary tract infections ?Swelling of the opening at the tip of the penis Circumcised men seem to have slightly lower rates of: ?Urinary tract infections ?Swelling of the opening at the tip of the penis ?Penis cancer ?HIV  and other infections that you catch during sex ?Cervical cancer in the women they have sex with Even so, in the [place], the risks of these problems are small - even in boys and men who have not been circumcised. Plus, boys and men who are not circumcised can reduce these extra risks by: ?Cleaning their penis well ?Using condoms during sex  What are the risks of circumcision?  Risks include: ?Bleeding or infection from the surgery ?Damage to or amputation of the penis ?A chance that the doctor will cut off too much or not enough of the foreskin ?A chance that sex won't feel as good later in life Only about 1 out of every 200  circumcisions leads to problems. There is also a chance that your health insurance won't pay for circumcision.  How is circumcision done in baby boys?  First, the baby gets medicine for pain relief. This might be a cream on the skin or a shot into the base of the penis. Next, the doctor cleans the baby's penis well. Then he or she uses special tools to cut off the foreskin. Finally, the doctor wraps a bandage (called gauze) around the baby's penis. If you have your baby circumcised, his doctor or nurse will give you instructions on how to care for him after the surgery. It is important that you follow those instructions carefully.   Deciding about Circumcision in Baby Boys  (The Basics)  What is circumcision?  Circumcision is a surgery that removes the skin that covers the tip of the penis, called the "foreskin" Circumcision is usually done when a boy is between 8 and 12 days old. In the [place], circumcision is common. In some other countries, fewer boys are circumcised. Circumcision is a common tradition in some religions.  Should I have my baby boy circumcised?  There is no easy answer. Circumcision has some benefits. But it also has risks. After talking with your doctor, you will have to decide for yourself what is right for your family.  What  are the benefits of circumcision?  Circumcised boys seem to have slightly lower rates of: ?Urinary tract infections ?Swelling of the opening at the tip of the penis Circumcised men seem to have slightly lower rates of: ?Urinary tract infections ?Swelling of the opening at the tip of the penis ?Penis cancer ?HIV and other infections that you catch during sex ?Cervical cancer in the women they have sex with Even so, in the [place], the risks of these problems are small - even in boys and men who have not been circumcised. Plus, boys and men who are not circumcised can reduce these extra risks by: ?Cleaning their penis well ?Using condoms during sex  What are the risks of circumcision?  Risks include: ?Bleeding or infection from the surgery ?Damage to or amputation of the penis ?A chance that the doctor will cut off too much or not enough of the foreskin ?A chance that sex won't feel as good later in life Only about 1 out of every 200 circumcisions leads to problems. There is also a chance that your health insurance won't pay for circumcision.  How is circumcision done in baby boys?  First, the baby gets medicine for pain relief. This might be a cream on the skin or a shot into the base of the penis. Next, the doctor cleans the baby's penis well. Then he or she uses special tools to cut off the foreskin. Finally, the doctor wraps a bandage (called gauze) around the baby's penis. If you have your baby circumcised, his doctor or nurse will give you instructions on how to care for him after the surgery. It is important that you follow those instructions carefully.  Maternity Assessment Unit (MAU)  The Maternity Assessment Unit (MAU) is located at the [facility] and [facility] at [facility]. The address is: [address] [place], [place], [place] [address]. Please see map below for additional directions.    The Maternity Assessment Unit is designed to help you  during your pregnancy, and for up to 6 weeks after delivery, with any pregnancy- or postpartum-related emergencies, if you think you are in labor, or if your  water has broken. For example, if you experience nausea and vomiting, vaginal bleeding, severe abdominal or pelvic pain, elevated blood pressure or other problems related to your pregnancy or postpartum time, please come to the Maternity Assessment Unit for assistance.  Preterm Labor and Birth Information  The normal length of a pregnancy is 39-41 weeks. Preterm labor is when labor starts before 37 completed weeks of pregnancy. What are the risk factors for preterm labor? Preterm labor is more likely to occur in women who:  Have certain infections during pregnancy such as a bladder infection, sexually transmitted infection, or infection inside the uterus (chorioamnionitis).  Have a shorter-than-normal cervix.  Have gone into preterm labor before.  Have had surgery on their cervix.  Are younger than age 58 or older than age 2.  Are African American.  Are pregnant with twins or multiple babies (multiple gestation).  Take street drugs or smoke while pregnant.  Do not gain enough weight while pregnant.  Became pregnant shortly after having been pregnant. What are the symptoms of preterm labor? Symptoms of preterm labor include:  Cramps similar to those that can happen during a menstrual period. The cramps may happen with diarrhea.  Pain in the abdomen or lower back.  Regular uterine contractions that may feel like tightening of the abdomen.  A feeling of increased pressure in the pelvis.  Increased watery or bloody mucus discharge from the vagina.  Water breaking (ruptured amniotic sac). Why is it important to recognize signs of preterm labor? It is important to recognize signs of preterm labor because babies who are born prematurely may not be fully developed. This can put them at an increased risk for:  Long-term  (chronic) heart and lung problems.  Difficulty immediately after birth with regulating body systems, including blood sugar, body temperature, heart rate, and breathing rate.  Bleeding in the brain.  Cerebral palsy.  Learning difficulties.  Death. These risks are highest for babies who are born before 34 weeks of pregnancy. How is preterm labor treated? Treatment depends on the length of your pregnancy, your condition, and the health of your baby. It may involve:  Having a stitch (suture) placed in your cervix to prevent your cervix from opening too early (cerclage).  Taking or being given medicines, such as: ? Hormone medicines. These may be given early in pregnancy to help support the pregnancy. ? Medicine to stop contractions. ? Medicines to help mature the baby's lungs. These may be prescribed if the risk of delivery is high. ? Medicines to prevent your baby from developing cerebral palsy. If the labor happens before 34 weeks of pregnancy, you may need to stay in the hospital. What should I do if I think I am in preterm labor? If you think that you are going into preterm labor, call your health care provider right away. How can I prevent preterm labor in future pregnancies? To increase your chance of having a full-term pregnancy:  Do not use any tobacco products, such as cigarettes, chewing tobacco, and e-cigarettes. If you need help quitting, ask your health care provider.  Do not use street drugs or medicines that have not been prescribed to you during your pregnancy.  Talk with your health care provider before taking any herbal supplements, even if you have been taking them regularly.  Make sure you gain a healthy amount of weight during your pregnancy.  Watch for infection. If you think that you might have an infection, get it checked right away.  Make  sure to tell your health care provider if you have gone into preterm labor before. This information is not intended to  replace advice given to you by your health care provider. Make sure you discuss any questions you have with your health care provider. Document Revised: 03/23/2019 Document Reviewed: 04/21/2016 Elsevier Patient Education  2020 [employer].

## 2020-01-22 NOTE — Progress Notes (Signed)
Subjective:  Bonnie Gilmore is a 29 y.o. G2P1001 at [redacted]w[redacted]d being seen today for ongoing prenatal care.  She is currently monitored for the following issues for this low-risk pregnancy and has Supervision of other normal pregnancy, antepartum and Language barrier on their problem list.  Patient reports no complaints.  Contractions: Not present. Vag. Bleeding: None.  Movement: Present. Denies leaking of fluid.   The following portions of the patient's history were reviewed and updated as appropriate: allergies, current medications, past family history, past medical history, past social history, past surgical history and problem list. Problem list updated.  Objective:   Vitals:   01/22/20 0920  BP: 106/79  Pulse: 91  Weight: 147 lb 9.6 oz (67 kg)    Fetal Status: Fetal Heart Rate (bpm): 151 Fundal Height: 30 cm Movement: Present     General:  Alert, oriented and cooperative. Patient is in no acute distress.  Skin: Skin is warm and dry. No rash noted.   Cardiovascular: Normal heart rate noted  Respiratory: Normal respiratory effort, no problems with respiration noted  Abdomen: Soft, gravid, appropriate for gestational age. Pain/Pressure: Absent     Pelvic: Vag. Bleeding: None     Cervical exam deferred        Extremities: Normal range of motion.  Edema: Trace  Mental Status: Normal mood and affect. Normal behavior. Normal judgment and thought content.    Assessment and Plan:  Pregnancy: G2P1001 at [redacted]w[redacted]d  1. Supervision of other normal pregnancy, antepartum -information given on circumcision  2. Language barrier -[place] interpreter used for entire visit  Preterm labor symptoms and general obstetric precautions including but not limited to vaginal bleeding, contractions, leaking of fluid and fetal movement were reviewed in detail with the patient. I discussed the assessment and treatment plan with the patient. The patient was provided an opportunity to ask questions and all were  answered. The patient agreed with the plan and demonstrated an understanding of the instructions. The patient was advised to call back or seek an in-person office evaluation/go to MAU at [facility] for any urgent or concerning symptoms. Please refer to After Visit Summary for other counseling recommendations.  Return in about 2 weeks (around 02/05/2020) for in-person ROB with translator.   Akshara Blumenthal, Odie Sera, NP

## 2020-02-04 ENCOUNTER — Ambulatory Visit (INDEPENDENT_AMBULATORY_CARE_PROVIDER_SITE_OTHER): Payer: Medicaid Other | Admitting: Nurse Practitioner

## 2020-02-04 ENCOUNTER — Encounter: Payer: Self-pay | Admitting: Obstetrics

## 2020-02-04 ENCOUNTER — Other Ambulatory Visit: Payer: Self-pay

## 2020-02-04 VITALS — BP 118/81 | HR 88 | Wt 152.0 lb

## 2020-02-04 DIAGNOSIS — Z3A32 32 weeks gestation of pregnancy: Secondary | ICD-10-CM

## 2020-02-04 DIAGNOSIS — Z3480 Encounter for supervision of other normal pregnancy, unspecified trimester: Secondary | ICD-10-CM

## 2020-02-04 DIAGNOSIS — Z789 Other specified health status: Secondary | ICD-10-CM

## 2020-02-04 MED ORDER — PRENATE ESSENTIAL 29-0.6-0.4-340 MG PO CAPS: 1.0000 | ORAL_CAPSULE | Freq: Every day | ORAL | 5 refills | Status: DC

## 2020-02-04 NOTE — Progress Notes (Signed)
ROB   CC: None    Pt consents to student in exam room .

## 2020-02-04 NOTE — Progress Notes (Signed)
    Subjective:  Bonnie Gilmore is a 29 y.o. G2P1001 at [redacted]w[redacted]d being seen today for ongoing prenatal care.  She is currently monitored for the following issues for this low-risk pregnancy and has Supervision of other normal pregnancy, antepartum; Language barrier; and Strain of cervical portion of both trapezius muscles on their problem list.  Patient reports no complaints.  Contractions: Irritability. Vag. Bleeding: None.  Movement: Present. Denies leaking of fluid.   The following portions of the patient's history were reviewed and updated as appropriate: allergies, current medications, past family history, past medical history, past social history, past surgical history and problem list. Problem list updated.  Objective:   Vitals:   02/04/20 1615  BP: 118/81  Pulse: 88  Weight: 152 lb (68.9 kg)    Fetal Status: Fetal Heart Rate (bpm): 149 Fundal Height: 30 cm Movement: Present     General:  Alert, oriented and cooperative. Patient is in no acute distress.  Skin: Skin is warm and dry. No rash noted.   Cardiovascular: Normal heart rate noted  Respiratory: Normal respiratory effort, no problems with respiration noted  Abdomen: Soft, gravid, appropriate for gestational age. Pain/Pressure: Absent     Pelvic:  Cervical exam deferred        Extremities: Normal range of motion.  Edema: Trace  Mental Status: Normal mood and affect. Normal behavior. Normal judgment and thought content.   Urinalysis:      Assessment and Plan:  Pregnancy: G2P1001 at [redacted]w[redacted]d  1. Supervision of other normal pregnancy, antepartum EDC updated based on first anatomy US.  Client was measuring small for dates but when [facility] updated, she was still on the lower range for uterine measurements.  Had [place] in January which shows that low lying placenta has resolved.  Reviewed with client.   2. Language barrier In person interpreter present with client for the entire visit.  Preterm labor symptoms and general obstetric  precautions including but not limited to vaginal bleeding, contractions, leaking of fluid and fetal movement were reviewed in detail with the patient. Please refer to After Visit Summary for other counseling recommendations.  Return in about 2 weeks (around 02/18/2020) for in person ROB.  Nolene Bernheim, RN, MSN, NP-BC Nurse Practitioner, [facility] for [employer], [facility] 02/04/2020 5:11 PM

## 2020-02-05 ENCOUNTER — Other Ambulatory Visit: Payer: Self-pay

## 2020-02-05 ENCOUNTER — Encounter: Payer: Medicaid Other | Admitting: Obstetrics and Gynecology

## 2020-02-05 MED ORDER — PRENATE ESSENTIAL 18-0.6-0.4-300 MG PO CAPS: 1.0000 | ORAL_CAPSULE | Freq: Every day | ORAL | 5 refills | Status: AC

## 2020-02-18 ENCOUNTER — Encounter: Payer: Self-pay | Admitting: Obstetrics

## 2020-02-18 ENCOUNTER — Other Ambulatory Visit: Payer: Self-pay

## 2020-02-18 ENCOUNTER — Encounter: Payer: Medicaid Other | Admitting: Certified Nurse Midwife

## 2020-02-18 ENCOUNTER — Ambulatory Visit (INDEPENDENT_AMBULATORY_CARE_PROVIDER_SITE_OTHER): Payer: 59 | Admitting: Obstetrics and Gynecology

## 2020-02-18 VITALS — BP 119/82 | HR 99 | Wt 157.0 lb

## 2020-02-18 DIAGNOSIS — Z3480 Encounter for supervision of other normal pregnancy, unspecified trimester: Secondary | ICD-10-CM

## 2020-02-18 DIAGNOSIS — Z3A34 34 weeks gestation of pregnancy: Secondary | ICD-10-CM

## 2020-02-18 DIAGNOSIS — Z789 Other specified health status: Secondary | ICD-10-CM

## 2020-02-18 DIAGNOSIS — Z3483 Encounter for supervision of other normal pregnancy, third trimester: Secondary | ICD-10-CM

## 2020-02-18 NOTE — Progress Notes (Signed)
   PRENATAL VISIT NOTE  Subjective:  Bonnie Gilmore is a 29 y.o. G2P1001 at [redacted]w[redacted]d being seen today for ongoing prenatal care.  She is currently monitored for the following issues for this low-risk pregnancy and has Supervision of other normal pregnancy, antepartum; Language barrier; and Strain of cervical portion of both trapezius muscles on their problem list.  Patient reports no complaints.  Contractions: Irritability. Vag. Bleeding: None.  Movement: Present. Denies leaking of fluid.   The following portions of the patient's history were reviewed and updated as appropriate: allergies, current medications, past family history, past medical history, past social history, past surgical history and problem list.   Objective:   Vitals:   02/18/20 1548  BP: 119/82  Pulse: 99  Weight: 157 lb (71.2 kg)    Fetal Status:     Movement: Present     General:  Alert, oriented and cooperative. Patient is in no acute distress.  Skin: Skin is warm and dry. No rash noted.   Cardiovascular: Normal heart rate noted  Respiratory: Normal respiratory effort, no problems with respiration noted  Abdomen: Soft, gravid, appropriate for gestational age.  Pain/Pressure: Present     Pelvic: Cervical exam deferred        Extremities: Normal range of motion.  Edema: Trace  Mental Status: Normal mood and affect. Normal behavior. Normal judgment and thought content.   Assessment and Plan:  Pregnancy: G2P1001 at [redacted]w[redacted]d 1. Supervision of other normal pregnancy, antepartum Patient is doing well without complaints Vaginal cultures next visit  2. Language barrier [place] interpreter used during this encounter  Preterm labor symptoms and general obstetric precautions including but not limited to vaginal bleeding, contractions, leaking of fluid and fetal movement were reviewed in detail with the patient. Please refer to After Visit Summary for other counseling recommendations.   Return in about 2 weeks (around  03/03/2020) for ROB, Low risk, in person, GBS.  Future Appointments  Date Time Provider Department Center  02/18/2020  4:00 PM Sheela Mcculley, Gigi Gin, MD [facility] None    Catalina Antigua, MD

## 2020-02-18 NOTE — Progress Notes (Signed)
Patient presents for ROB. Pt states that she has been experiencing cramps in her legs. No other concerns.

## 2020-03-03 ENCOUNTER — Encounter: Payer: Self-pay | Admitting: Obstetrics

## 2020-03-03 ENCOUNTER — Other Ambulatory Visit: Payer: Self-pay

## 2020-03-03 ENCOUNTER — Ambulatory Visit (INDEPENDENT_AMBULATORY_CARE_PROVIDER_SITE_OTHER): Payer: 59 | Admitting: Advanced Practice Midwife

## 2020-03-03 ENCOUNTER — Other Ambulatory Visit (HOSPITAL_COMMUNITY)
Admission: RE | Admit: 2020-03-03 | Discharge: 2020-03-03 | Disposition: A | Discharge status: Home / Self Care | Payer: 59 | Source: Ambulatory Visit | Attending: Advanced Practice Midwife | Admitting: Advanced Practice Midwife

## 2020-03-03 VITALS — BP 128/82 | HR 93 | Wt 159.0 lb

## 2020-03-03 DIAGNOSIS — Z3A36 36 weeks gestation of pregnancy: Secondary | ICD-10-CM

## 2020-03-03 DIAGNOSIS — O99893 Other specified diseases and conditions complicating puerperium: Secondary | ICD-10-CM

## 2020-03-03 DIAGNOSIS — Z3480 Encounter for supervision of other normal pregnancy, unspecified trimester: Secondary | ICD-10-CM | POA: Diagnosis present

## 2020-03-03 DIAGNOSIS — G479 Sleep disorder, unspecified: Secondary | ICD-10-CM

## 2020-03-03 DIAGNOSIS — Z789 Other specified health status: Secondary | ICD-10-CM

## 2020-03-03 DIAGNOSIS — Z603 Acculturation difficulty: Secondary | ICD-10-CM

## 2020-03-03 MED ORDER — DIPHENHYDRAMINE HCL 50 MG PO TABS: 25.0000 mg | ORAL_TABLET | Freq: Every evening | ORAL | 0 refills | Status: DC | PRN

## 2020-03-03 NOTE — Progress Notes (Addendum)
   PRENATAL VISIT NOTE  Subjective:  Bonnie Gilmore is a 29 y.o. G2P1001 at [redacted]w[redacted]d being seen today for ongoing prenatal care.  She is currently monitored for the following issues for this low-risk pregnancy and has Supervision of other normal pregnancy, antepartum; Language barrier; and Strain of cervical portion of both trapezius muscles on their problem list.  Patient reports occasional contractions and trouble sleeping . Vag. Bleeding: None.  Movement: Present. Denies leaking of fluid.   The following portions of the patient's history were reviewed and updated as appropriate: allergies, current medications, past family history, past medical history, past social history, past surgical history and problem list.   Objective:   Vitals:   03/03/20 1600  BP: 128/82  Pulse: 93  Weight: 159 lb (72.1 kg)    Fetal Status:     Movement: Present. FHR around 140. Cephalic presentation.     General:  Alert, oriented and cooperative. Patient is in no acute distress.  Skin: Skin is warm and dry. No rash noted.   Cardiovascular: No palpitations reported  Respiratory: Normal respiratory effort, no problems with respiration noted  Abdomen: Soft, gravid, appropriate for gestational age.  Pain/Pressure: Absent     Pelvic: Cervical exam performed in the presence of a chaperone. 1cm dilated and 50% effaced        Extremities: Normal range of motion.  Edema: Trace  Mental Status: Normal mood and affect. Normal behavior. Normal judgment and thought content.   Assessment and Plan:  Pregnancy: G2P1001 at [redacted]w[redacted]d 1. Supervision of other normal pregnancy, antepartum -Continue routine prenatal care, follow up in 2 weeks, virtual visit -Gonorrhea/chlamydia, GBS swab. Follow up with abnormal results -Benadryl for sleep  Orders Placed This Encounter  Procedures   Strep Gp B NAA    2. Language barrier [place] Interpreter used for visit.  Preterm labor symptoms and general obstetric precautions including  but not limited to vaginal bleeding, contractions, leaking of fluid and fetal movement were reviewed in detail with the patient. The patient was advised to call back or see an in-person office evaluation/go to MAU at [facility] for any urgent or concerning symptoms. Please refer to After Visit Summary for other counseling recommendations.   No follow-ups on file.    Lilyan Gilford, Medical Student   Attestation of Supervision of Student:  I confirm that I have verified the information documented in the medical student's note and that I have also personally performed the history, physical exam and all medical decision making activities.  I have verified that all services and findings are accurately documented in this student's note; and I agree with management and plan as outlined in the documentation. I have also made any necessary editorial changes.  See my complete note.  Sharen Counter, CNM Certified Nurse[facility]Midwife Center for [employer], [facility] 03/05/2020 4:31 PM

## 2020-03-03 NOTE — Progress Notes (Signed)
Patient reports fetal movement, denies pain. 

## 2020-03-04 LAB — CERVICOVAGINAL ANCILLARY ONLY
Chlamydia: NEGATIVE
Comment: NEGATIVE
Comment: NORMAL
Neisseria Gonorrhea: NEGATIVE

## 2020-03-05 ENCOUNTER — Encounter: Payer: Self-pay | Admitting: Advanced Practice Midwife

## 2020-03-05 DIAGNOSIS — O99820 Streptococcus B carrier state complicating pregnancy: Secondary | ICD-10-CM | POA: Insufficient documentation

## 2020-03-05 LAB — STREP GP B NAA: Strep Gp B NAA: POSITIVE — AB

## 2020-03-05 NOTE — Progress Notes (Signed)
   PRENATAL VISIT NOTE  Subjective:  Bonnie Gilmore is a 29 y.o. G2P1001 at [redacted]w[redacted]d being seen today for ongoing prenatal care.  She is currently monitored for the following issues for this low-risk pregnancy and has Supervision of other normal pregnancy, antepartum; Language barrier; Strain of cervical portion of both trapezius muscles; and GBS (group B Streptococcus carrier), +RV culture, currently pregnant on their problem list.  Patient reports unable to sleep more than 2 hours at night.  Contractions: Not present. Vag. Bleeding: None.  Movement: Present. Denies leaking of fluid.   The following portions of the patient's history were reviewed and updated as appropriate: allergies, current medications, past family history, past medical history, past social history, past surgical history and problem list.   Objective:   Vitals:   03/03/20 1600  BP: 128/82  Pulse: 93  Weight: 159 lb (72.1 kg)    Fetal Status: Fetal Heart Rate (bpm): 142 Fundal Height: 36 cm Movement: Present  Presentation: Vertex  General:  Alert, oriented and cooperative. Patient is in no acute distress.  Skin: Skin is warm and dry. No rash noted.   Cardiovascular: Normal heart rate noted  Respiratory: Normal respiratory effort, no problems with respiration noted  Abdomen: Soft, gravid, appropriate for gestational age.  Pain/Pressure: Absent     Pelvic: Cervical exam performed in the presence of a chaperone Dilation: 1 Effacement (%): 50 Station: -2  Extremities: Normal range of motion.  Edema: Trace  Mental Status: Normal mood and affect. Normal behavior. Normal judgment and thought content.   Assessment and Plan:  Pregnancy: G2P1001 at [redacted]w[redacted]d 1. Supervision of other normal pregnancy, antepartum --Anticipatory guidance about next visits/weeks of pregnancy given. --Next visit in 2 weeks virtual - Cervicovaginal ancillary only( [place]) - Strep Gp B NAA  2. Language barrier --[place]  interpreter used  3. Sleep disturbance --Discussed sleep hygiene. May use Benadryl PRN. - diphenhydrAMINE (BENADRYL) 50 MG tablet; Take 0.5 tablets (25 mg total) by mouth at bedtime as needed for sleep.  Dispense: 30 tablet; Refill: 0  Term labor symptoms and general obstetric precautions including but not limited to vaginal bleeding, contractions, leaking of fluid and fetal movement were reviewed in detail with the patient. Please refer to After Visit Summary for other counseling recommendations.   No follow-ups on file.  Future Appointments  Date Time Provider Department Center  03/18/2020  3:40 PM Nugent, Odie Sera, NP [facility] None    Sharen Counter, CNM

## 2020-03-18 ENCOUNTER — Other Ambulatory Visit: Payer: Self-pay

## 2020-03-18 ENCOUNTER — Encounter: Payer: Self-pay | Admitting: Obstetrics

## 2020-03-18 ENCOUNTER — Ambulatory Visit (INDEPENDENT_AMBULATORY_CARE_PROVIDER_SITE_OTHER): Payer: 59 | Admitting: Women's Health

## 2020-03-18 ENCOUNTER — Encounter: Payer: Self-pay | Admitting: Women's Health

## 2020-03-18 VITALS — BP 123/64 | HR 90 | Wt 164.6 lb

## 2020-03-18 DIAGNOSIS — Z3A38 38 weeks gestation of pregnancy: Secondary | ICD-10-CM

## 2020-03-18 DIAGNOSIS — Z3480 Encounter for supervision of other normal pregnancy, unspecified trimester: Secondary | ICD-10-CM

## 2020-03-18 DIAGNOSIS — O99820 Streptococcus B carrier state complicating pregnancy: Secondary | ICD-10-CM

## 2020-03-18 DIAGNOSIS — Z789 Other specified health status: Secondary | ICD-10-CM

## 2020-03-18 NOTE — Progress Notes (Signed)
Subjective:  Bonnie Gilmore is a 29 y.o. G2P1001 at [redacted]w[redacted]d being seen today for ongoing prenatal care.  She is currently monitored for the following issues for this low-risk pregnancy and has Supervision of other normal pregnancy, antepartum; Language barrier; Strain of cervical portion of both trapezius muscles; and GBS (group B Streptococcus carrier), +RV culture, currently pregnant on their problem list.  Patient reports no complaints.  Contractions: Not present. Vag. Bleeding: None.  Movement: Present. Denies leaking of fluid.   The following portions of the patient's history were reviewed and updated as appropriate: allergies, current medications, past family history, past medical history, past social history, past surgical history and problem list. Problem list updated.  Objective:   Vitals:   03/18/20 1536  BP: 123/64  Pulse: 90  Weight: 164 lb 9.6 oz (74.7 kg)    Fetal Status: Fetal Heart Rate (bpm): 140 Fundal Height: 37 cm Movement: Present     General:  Alert, oriented and cooperative. Patient is in no acute distress.  Skin: Skin is warm and dry. No rash noted.   Cardiovascular: Normal heart rate noted  Respiratory: Normal respiratory effort, no problems with respiration noted  Abdomen: Soft, gravid, appropriate for gestational age. Pain/Pressure: Absent     Pelvic: Vag. Bleeding: None     Cervical exam deferred        Extremities: Normal range of motion.  Edema: Trace  Mental Status: Normal mood and affect. Normal behavior. Normal judgment and thought content.   Urinalysis:      Assessment and Plan:  Pregnancy: G2P1001 at [redacted]w[redacted]d  1. Supervision of other normal pregnancy, antepartum   2. GBS (group B Streptococcus carrier), +RV culture, currently pregnant -will treat in labor, discussed with patient  3. Language barrier -Montagnard Jarai/Vietnamese interpreter used  I discussed the assessment and treatment plan with the patient. The patient was provided an opportunity  to ask questions and all were answered. The patient agreed with the plan and demonstrated an understanding of the instructions. The patient was advised to call back or seek an in-person office evaluation/go to MAU at [facility] for any urgent or concerning symptoms. Term labor symptoms and general obstetric precautions including but not limited to vaginal bleeding, contractions, leaking of fluid and fetal movement were reviewed in detail with the patient. Please refer to After Visit Summary for other counseling recommendations.  Return in about 1 week (around 03/25/2020) for in-person ROB.   Kmari Brian, Odie Sera, NP

## 2020-03-18 NOTE — Patient Instructions (Addendum)
Signs and Symptoms of Labor Labor is your body's natural process of moving your baby, placenta, and umbilical cord out of your uterus. The process of labor usually starts when your baby is full-term, between 37 and 40 weeks of pregnancy. How will I know when I am close to going into labor? As your body prepares for labor and the birth of your baby, you may notice the following symptoms in the weeks and days before true labor starts:  Having a strong desire to get your home ready to receive your new baby. This is called nesting. Nesting may be a sign that labor is approaching, and it may occur several weeks before birth. Nesting may involve cleaning and organizing your home.  Passing a small amount of thick, bloody mucus out of your vagina (normal bloody show or losing your mucus plug). This may happen more than a week before labor begins, or it might occur right before labor begins as the opening of the cervix starts to widen (dilate). For some women, the entire mucus plug passes at once. For others, smaller portions of the mucus plug may gradually pass over several days.  Your baby moving (dropping) lower in your pelvis to get into position for birth (lightening). When this happens, you may feel more pressure on your bladder and pelvic bone and less pressure on your ribs. This may make it easier to breathe. It may also cause you to need to urinate more often and have problems with bowel movements.  Having "practice contractions" (Braxton Hicks contractions) that occur at irregular (unevenly spaced) intervals that are more than 10 minutes apart. This is also called false labor. False labor contractions are common after exercise or sexual activity, and they will stop if you change position, rest, or drink fluids. These contractions are usually mild and do not get stronger over time. They may feel like: ? A backache or back pain. ? Mild cramps, similar to menstrual cramps. ? Tightening or pressure in  your abdomen. Other early symptoms that labor may be starting soon include:  Nausea or loss of appetite.  Diarrhea.  Having a sudden burst of energy, or feeling very tired.  Mood changes.  Having trouble sleeping. How will I know when labor has begun? Signs that true labor has begun may include:  Having contractions that come at regular (evenly spaced) intervals and increase in intensity. This may feel like more intense tightening or pressure in your abdomen that moves to your back. ? Contractions may also feel like rhythmic pain in your upper thighs or back that comes and goes at regular intervals. ? For first-time mothers, this change in intensity of contractions often occurs at a more gradual pace. ? Women who have given birth before may notice a more rapid progression of contraction changes.  Having a feeling of pressure in the vaginal area.  Your water breaking (rupture of membranes). This is when the sac of fluid that surrounds your baby breaks. When this happens, you will notice fluid leaking from your vagina. This may be clear or blood-tinged. Labor usually starts within 24 hours of your water breaking, but it may take longer to begin. ? Some women notice this as a gush of fluid. ? Others notice that their underwear repeatedly becomes damp. Follow these instructions at home:   When labor starts, or if your water breaks, call your health care provider or nurse care line. Based on your situation, they will determine when you should go in for an   exam.  When you are in early labor, you may be able to rest and manage symptoms at home. Some strategies to try at home include: ? Breathing and relaxation techniques. ? Taking a warm bath or shower. ? Listening to music. ? Using a heating pad on the lower back for pain. If you are directed to use heat:  Place a towel between your skin and the heat source.  Leave the heat on for 20-30 minutes.  Remove the heat if your skin turns  bright red. This is especially important if you are unable to feel pain, heat, or cold. You may have a greater risk of getting burned. Get help right away if:  You have painful, regular contractions that are 5 minutes apart or less.  Labor starts before you are [redacted] weeks along in your pregnancy.  You have a fever.  You have a headache that does not go away.  You have bright red blood coming from your vagina.  You do not feel your baby moving.  You have a sudden onset of: ? Severe headache with vision problems. ? Nausea, vomiting, or diarrhea. ? Chest pain or shortness of breath. These symptoms may be an emergency. If your health care provider recommends that you go to the hospital or birth center where you plan to deliver, do not drive yourself. Have someone else drive you, or call emergency services (911 in the [place] Summary  Labor is your body's natural process of moving your baby, placenta, and umbilical cord out of your uterus.  The process of labor usually starts when your baby is full-term, between 5 and 40 weeks of pregnancy.  When labor starts, or if your water breaks, call your health care provider or nurse care line. Based on your situation, they will determine when you should go in for an exam. This information is not intended to replace advice given to you by your health care provider. Make sure you discuss any questions you have with your health care provider. Document Revised: 08/29/2017 Document Reviewed: 05/06/2017 Elsevier Patient Education  2020 Elsevier Inc.     Maternity Assessment Unit (MAU)  The Maternity Assessment Unit (MAU) is located at the [facility] at [facility]. The address is: [address], [place], [place], [place] [address]. Please see map below for additional directions.    The Maternity Assessment Unit is designed to help you during your pregnancy, and for up to 6 weeks after delivery, with any pregnancy-  or postpartum-related emergencies, if you think you are in labor, or if your water has broken. For example, if you experience nausea and vomiting, vaginal bleeding, severe abdominal or pelvic pain, elevated blood pressure or other problems related to your pregnancy or postpartum time, please come to the Maternity Assessment Unit for assistance.    Nhi?m lin c?u khu?n nhm B trong khi mang thai Group B Streptococcus Infection During Pregnancy Lin c?u khu?n nhm B (Group B Streptococcus, GBS) l m?t lo?i vi khu?n th??ng tm th?y ? ng??i kh?e m?nh. Vi khu?n ny th??ng th?y ? tr?c trng, m ??o v ru?t. ? nh?ng ng??i kh?e m?nh v khng mang thai, vi khu?n ny hi?m khi gy b?nh nghim tr?ng ho?c gy cc bi?n ch?ng nghim tr?ng. Tuy nhin, nh?ng ph? n? c xt nghi?m d??ng tnh v?i GBS trong th?i gian mang [place] c th? truy?n vi khu?n ny sang con c?a mnh trong khi sinh. ?i?u ny c th? gy nhi?m trng nghim tr?ng ?  em b sau khi sinh. Ph? n? nhi?m GBS c?ng c th? b? nhi?m trng trong khi mang thai ho?c ngay sau khi sinh con. Cc b?nh nhi?m trng bao g?m nhi?m trng ???ng ti?u (UTI) ho?c nhi?m trng t? cung. GBS c?ng lm t?ng nguy c? b? cc bi?n ch?ng ? ph? n? trong th?i k? mang thai, ch?ng h?n nh? chuy?n d? s?m hay sinh non, s?y [place], ho?c thai ch?t l?u. T?t c? ph? n? mang thai ??u ???c khuy?n ngh? xt nghi?m GBS th??ng quy. Nguyn nhn g gy ra? Tnh tr?ng ny do vi khu?n tn l Streptococcus agalactiae gy ra. ?i?u g lm t?ng nguy c?? Qu v? c th? c nguy c? nhi?m GBS cao h?n trong th?i k? mang thai n?u qu v? ? t?ng b? nhi?m m?t l?n trong l?n mang thai tr??c. C cc d?u hi?u ho?c tri?u ch?ng g? Trong h?u h?t cc tr??ng h?p, nhi?m GBS khng gy ra cc tri?u ch?ng ? ph? n? mang [place]. N?u c tri?u ch?ng, cc tri?u ch?ng ? c th? bao g?m:  Chuy?n d? b?t ??u tr??c tu?n th? 43 c?a thai k?.  Nhi?m trng ???ng ti?u ho?c nhi?m trng bng quang. Tnh tr?ng ny c th? gy s?t, ?i ti?u th??ng xuyn, ho?c  ?au v nng rt trong khi ti?u ti?n.  S?t trong qu trnh chuy?n d?. C?ng c th? c nh?p ??p c?a tim nhanh ? m? ho?c em b. Cc tri?u ch?ng hi?m g?p nh?ng nghim tr?ng c?a nhi?m trng do GBS ? ph? n? bao g?m:  Nhi?m trng mu (nhi?m khu?n huy?t). Tnh tr?ng ny c th? gy s?t, ?n l?nh, ho?c l l?n.  Nhi?m trng ph?i (vim ph?i). Tnh tr?ng ny c th? gy s?t, ?n l?nh, ho, th? nhanh, ?au ng?c ho?c kh th?.  Nhi?m trng x??ng, kh?p, da, ho?c m m?m. Ch?n ?on tnh tr?ng ny nh? th? no? Qu v? c th? ???c sng l?c GBS t? tu?n 35 ??n tu?n 37 c?a thai k?. N?u qu v? c cc tri?u ch?ng chuy?n d? s?m, qu v? c th? ???c sng l?c s?m h?n. Tnh tr?ng ny ???c ch?n ?on d?a trn cc k?t qu? xt nghi?m t?:  T?m bng ti?t trng l?y b?nh ph?m d?ch ? m ??o v tr?c trng.  M?u n??c ti?u. Tnh tr?ng ny ???c ?i?u tr? nh? th? no? Tnh tr?ng ny ???c ?i?u tr? b?ng thu?c khng sinh. Thu?c khng sinh c th? ???c cho dng:  Qu v? khi ?i chuy?n d?, ho?c ngay khi v? ?i. Thu?c s? ti?p t?c ???c dng cho ??n sau khi qu v? sinh con. N?u qu v? sinh m?, qu v? khng c?n dng thu?c khng sinh tr? khi ? v? ?i.  V?i con qu v?, n?u tr? c?n ?i?u tr?Shaune Pascal gia ch?m [place] s?c kh?e s? ki?m tra tr? ?? quy?t ??nh xem tr? c c?n khng sinh ?? ng?n ng?a nhi?m trng nghim tr?ng hay khng. Tun th? nh?ng h??ng d?n ny ? nh:  Ch? s? d?ng thu?c khng k ??n v thu?c k ??n theo ch? d?n c?a chuyn gia ch?m [place] s?c kh?e.  Dng thu?c khng sinh theo ch? d?n c?a chuyn gia ch?m  s?c kh?e. Khng d?ng s? d?ng thu?c khng sinh ngay c? khi qu v? b?t ??u c?m th?y ?? h?n.  Tun th? t?t c? cc cu?c h?n khm l?i tr??c khi sinh (ti?n s?n) theo ch? d?n c?a chuyn gia ch?m  s?c kh?e. ?i?u ny c vai tr quan tr?ng. Hy lin l?c v?i [place]  gia ch?m [place] s?c kh?e n?u:  Qu v? b? ?au ho?c ?au ra?t khi ?i ti?u.  Qu v? ph?i ti?u ti?n th??ng xuyn h?n bnh th??ng.  Qu v? b? s?t ho?c ?n l?nh.  Qu v? ra kh h? ? m ??o c mi  kh ch?u. Yu c?u tr? gip ngay l?p t?c n?u:  Qu v? b? v? ?i.  Qu v? chuy?n d?.  Qu v? b? ?au r?t nhi?u ? b?ng.  Qu v? b? kh th?.  Qu v? b? ?au ng?c. Nh?ng tri?u ch?ng ny c th? l bi?u hi?n c?a m?t v?n ?? nghim tr?ng c?n c?p c?u. Khng ch? xem tri?u ch?ng c h?t khng. Hy ?i khm ngay l?p t?c. G?i cho d?ch v? c?p c?u t?i ??a ph??ng (911 ? [place] Khng t? li xe ??n b?nh vi?n. Tm t?t  GBS l m?t lo?i vi khu?n th??ng g?p ? ng??i kh?e m?nh.  Trong th?i gian mang thai, GBS c? tr c th? gy ra cc bi?n ch?ng nghim tr?ng cho qu v? ho?c em b.  Chuyn gia ch?m [place] s?c kh?e s? khm sng l?c cho qu v? trong kho?ng 35 v 37 tu?n mang thai ?? xc ??nh xem qu v? c GBS c? tr hay khng.  N?u qu v? c GBS c? tr trong khi mang thai, chuyn gia ch?m [place] s?c kh?e s? khuy?n ngh? s? d?ng khng sinh qua ???ng truy?n t?nh m?ch trong qu trnh chuy?n d?.  Sau khi sinh, con qu v? s? ???c ?nh gi cc bi?n ch?ng lin quan ??n nhi?m GBS ti?m ?n v c th? c?n khng sinh ?? ng?n ng?a nhi?m trng nghim tr?ng. Thng tin ny khng nh?m m?c ?ch thay th? cho l?i khuyn m chuyn gia ch?m [place] s?c kh?e ni v?i qu v?. Hy b?o ??m qu v? ph?i th?o lu?n b?t k? v?n ?? g m qu v? c v?i chuyn gia ch?m [place] s?c kh?e c?a qu v?. Document Revised: 08/10/2019 Document Reviewed: 08/10/2019 Elsevier Patient Education  2020 [employer].

## 2020-03-18 NOTE — Progress Notes (Signed)
Pt presents for ROB no complaints per pt  

## 2020-03-21 ENCOUNTER — Inpatient Hospital Stay (HOSPITAL_COMMUNITY): Payer: 59 | Admitting: Anesthesiology

## 2020-03-21 ENCOUNTER — Other Ambulatory Visit: Payer: Self-pay

## 2020-03-21 ENCOUNTER — Inpatient Hospital Stay (HOSPITAL_COMMUNITY)
Admission: AD | Admit: 2020-03-21 | Discharge: 2020-03-22 | DRG: 807 | Disposition: A | Discharge status: Home / Self Care | Payer: 59 | Attending: Obstetrics & Gynecology | Admitting: Obstetrics & Gynecology

## 2020-03-21 ENCOUNTER — Encounter (HOSPITAL_COMMUNITY): Payer: Self-pay | Admitting: Family Medicine

## 2020-03-21 DIAGNOSIS — Z603 Acculturation difficulty: Secondary | ICD-10-CM | POA: Diagnosis present

## 2020-03-21 DIAGNOSIS — O26893 Other specified pregnancy related conditions, third trimester: Secondary | ICD-10-CM | POA: Diagnosis present

## 2020-03-21 DIAGNOSIS — Z20822 Contact with and (suspected) exposure to covid-19: Secondary | ICD-10-CM | POA: Diagnosis present

## 2020-03-21 DIAGNOSIS — O99820 Streptococcus B carrier state complicating pregnancy: Secondary | ICD-10-CM

## 2020-03-21 DIAGNOSIS — Z789 Other specified health status: Secondary | ICD-10-CM | POA: Diagnosis present

## 2020-03-21 DIAGNOSIS — O99824 Streptococcus B carrier state complicating childbirth: Secondary | ICD-10-CM | POA: Diagnosis present

## 2020-03-21 DIAGNOSIS — Z3A38 38 weeks gestation of pregnancy: Secondary | ICD-10-CM

## 2020-03-21 LAB — CBC
HCT: 39.2 % (ref 36.0–46.0)
Hemoglobin: 13.1 g/dL (ref 12.0–15.0)
MCH: 29.9 pg (ref 26.0–34.0)
MCHC: 33.4 g/dL (ref 30.0–36.0)
MCV: 89.5 fL (ref 80.0–100.0)
Platelets: 226 10*3/uL (ref 150–400)
RBC: 4.38 MIL/uL (ref 3.87–5.11)
RDW: 12.8 % (ref 11.5–15.5)
WBC: 12.5 10*3/uL — ABNORMAL HIGH (ref 4.0–10.5)
nRBC: 0 % (ref 0.0–0.2)

## 2020-03-21 LAB — RPR: RPR Ser Ql: NONREACTIVE

## 2020-03-21 LAB — RESPIRATORY PANEL BY RT PCR (FLU A&B, COVID)
Influenza A by PCR: NEGATIVE
Influenza B by PCR: NEGATIVE
SARS Coronavirus 2 by RT PCR: NEGATIVE

## 2020-03-21 LAB — ABO/RH: ABO/RH(D): O POS

## 2020-03-21 LAB — TYPE AND SCREEN
ABO/RH(D): O POS
Antibody Screen: NEGATIVE

## 2020-03-21 MED ORDER — LACTATED RINGERS IV SOLN: 500.0000 mL | Freq: Once | INTRAVENOUS | Status: DC

## 2020-03-21 MED ORDER — FENTANYL-BUPIVACAINE-NACL 0.5-0.125-0.9 MG/250ML-% EP SOLN
12.0000 mL/h | EPIDURAL | Status: DC | PRN
  Filled 2020-03-21: qty 250

## 2020-03-21 MED ORDER — WITCH HAZEL-GLYCERIN EX PADS: 1.0000 "application " | MEDICATED_PAD | CUTANEOUS | Status: DC | PRN

## 2020-03-21 MED ORDER — SODIUM CHLORIDE (PF) 0.9 % IJ SOLN
INTRAMUSCULAR | Status: DC | PRN
  Administered 2020-03-21: 12 mL/h via EPIDURAL

## 2020-03-21 MED ORDER — EPHEDRINE 5 MG/ML INJ: 10.0000 mg | INTRAVENOUS | Status: DC | PRN

## 2020-03-21 MED ORDER — MEASLES, MUMPS & RUBELLA VAC IJ SOLR: 0.5000 mL | Freq: Once | INTRAMUSCULAR | Status: DC

## 2020-03-21 MED ORDER — SOD CITRATE-CITRIC ACID 500-334 MG/5ML PO SOLN: 30.0000 mL | ORAL | Status: DC | PRN

## 2020-03-21 MED ORDER — COCONUT OIL OIL: 1.0000 "application " | TOPICAL_OIL | Status: DC | PRN

## 2020-03-21 MED ORDER — FLEET ENEMA 7-19 GM/118ML RE ENEM: 1.0000 | ENEMA | RECTAL | Status: DC | PRN

## 2020-03-21 MED ORDER — ONDANSETRON HCL 4 MG PO TABS: 4.0000 mg | ORAL_TABLET | ORAL | Status: DC | PRN

## 2020-03-21 MED ORDER — ACETAMINOPHEN 325 MG PO TABS: 650.0000 mg | ORAL_TABLET | ORAL | Status: DC | PRN

## 2020-03-21 MED ORDER — TETANUS-DIPHTH-ACELL PERTUSSIS 5-2.5-18.5 LF-MCG/0.5 IM SUSP: 0.5000 mL | Freq: Once | INTRAMUSCULAR | Status: DC

## 2020-03-21 MED ORDER — LACTATED RINGERS IV SOLN: 500.0000 mL | INTRAVENOUS | Status: DC | PRN

## 2020-03-21 MED ORDER — OXYTOCIN 40 UNITS IN NORMAL SALINE INFUSION - SIMPLE MED
1.0000 m[IU]/min | INTRAVENOUS | Status: DC
  Administered 2020-03-21: 2 m[IU]/min via INTRAVENOUS

## 2020-03-21 MED ORDER — OXYCODONE-ACETAMINOPHEN 5-325 MG PO TABS: 1.0000 | ORAL_TABLET | ORAL | Status: DC | PRN

## 2020-03-21 MED ORDER — SENNOSIDES-DOCUSATE SODIUM 8.6-50 MG PO TABS
2.0000 | ORAL_TABLET | ORAL | Status: DC
  Administered 2020-03-22: 2 via ORAL
  Filled 2020-03-21: qty 2

## 2020-03-21 MED ORDER — SODIUM CHLORIDE 0.9 % IV SOLN
5.0000 10*6.[IU] | Freq: Once | INTRAVENOUS | Status: AC
  Administered 2020-03-21: 5 10*6.[IU] via INTRAVENOUS
  Filled 2020-03-21: qty 5

## 2020-03-21 MED ORDER — OXYTOCIN BOLUS FROM INFUSION
500.0000 mL | Freq: Once | INTRAVENOUS | Status: AC
  Administered 2020-03-21: 500 mL via INTRAVENOUS

## 2020-03-21 MED ORDER — DIPHENHYDRAMINE HCL 25 MG PO CAPS: 25.0000 mg | ORAL_CAPSULE | Freq: Four times a day (QID) | ORAL | Status: DC | PRN

## 2020-03-21 MED ORDER — ONDANSETRON HCL 4 MG/2ML IJ SOLN: 4.0000 mg | Freq: Four times a day (QID) | INTRAMUSCULAR | Status: DC | PRN

## 2020-03-21 MED ORDER — SIMETHICONE 80 MG PO CHEW: 80.0000 mg | CHEWABLE_TABLET | ORAL | Status: DC | PRN

## 2020-03-21 MED ORDER — PHENYLEPHRINE 40 MCG/ML (10ML) SYRINGE FOR IV PUSH (FOR BLOOD PRESSURE SUPPORT): 80.0000 ug | PREFILLED_SYRINGE | INTRAVENOUS | Status: DC | PRN

## 2020-03-21 MED ORDER — ZOLPIDEM TARTRATE 5 MG PO TABS: 5.0000 mg | ORAL_TABLET | Freq: Every evening | ORAL | Status: DC | PRN

## 2020-03-21 MED ORDER — OXYCODONE-ACETAMINOPHEN 5-325 MG PO TABS: 2.0000 | ORAL_TABLET | ORAL | Status: DC | PRN

## 2020-03-21 MED ORDER — LIDOCAINE HCL (PF) 1 % IJ SOLN
30.0000 mL | INTRAMUSCULAR | Status: AC | PRN
  Administered 2020-03-21: 06:00:00 4 mL via SUBCUTANEOUS
  Administered 2020-03-21: 6 mL via SUBCUTANEOUS

## 2020-03-21 MED ORDER — PHENYLEPHRINE 40 MCG/ML (10ML) SYRINGE FOR IV PUSH (FOR BLOOD PRESSURE SUPPORT)
80.0000 ug | PREFILLED_SYRINGE | INTRAVENOUS | Status: DC | PRN
  Filled 2020-03-21: qty 10

## 2020-03-21 MED ORDER — LACTATED RINGERS IV SOLN: INTRAVENOUS | Status: DC

## 2020-03-21 MED ORDER — OXYTOCIN 40 UNITS IN NORMAL SALINE INFUSION - SIMPLE MED
2.5000 [IU]/h | INTRAVENOUS | Status: DC
  Filled 2020-03-21: qty 1000

## 2020-03-21 MED ORDER — TERBUTALINE SULFATE 1 MG/ML IJ SOLN: 0.2500 mg | Freq: Once | INTRAMUSCULAR | Status: DC | PRN

## 2020-03-21 MED ORDER — OXYCODONE HCL 5 MG PO TABS: 5.0000 mg | ORAL_TABLET | ORAL | Status: DC | PRN

## 2020-03-21 MED ORDER — DIBUCAINE (PERIANAL) 1 % EX OINT: 1.0000 "application " | TOPICAL_OINTMENT | CUTANEOUS | Status: DC | PRN

## 2020-03-21 MED ORDER — DIPHENHYDRAMINE HCL 50 MG/ML IJ SOLN: 12.5000 mg | INTRAMUSCULAR | Status: DC | PRN

## 2020-03-21 MED ORDER — BENZOCAINE-MENTHOL 20-0.5 % EX AERO: 1.0000 "application " | INHALATION_SPRAY | CUTANEOUS | Status: DC | PRN

## 2020-03-21 MED ORDER — PENICILLIN G POT IN DEXTROSE 60000 UNIT/ML IV SOLN
3.0000 10*6.[IU] | INTRAVENOUS | Status: DC
  Administered 2020-03-21: 3 10*6.[IU] via INTRAVENOUS
  Filled 2020-03-21 (×2): qty 50

## 2020-03-21 MED ORDER — ONDANSETRON HCL 4 MG/2ML IJ SOLN: 4.0000 mg | INTRAMUSCULAR | Status: DC | PRN

## 2020-03-21 MED ORDER — IBUPROFEN 600 MG PO TABS
600.0000 mg | ORAL_TABLET | Freq: Four times a day (QID) | ORAL | Status: DC
  Administered 2020-03-21 – 2020-03-22 (×3): 600 mg via ORAL
  Filled 2020-03-21 (×4): qty 1

## 2020-03-21 MED ORDER — PRENATAL MULTIVITAMIN CH
1.0000 | ORAL_TABLET | Freq: Every day | ORAL | Status: DC
  Filled 2020-03-21: qty 1

## 2020-03-21 NOTE — MAU Note (Signed)
.   Bonnie Gilmore is a 29 y.o. at [redacted]w[redacted]d here in MAU reporting: patient reports ctx that started at 2200. She reports bloody show and no LOF. Endorses good fetal movement.   Pain score: 7 Vitals:   03/21/20 0159  BP: 113/74  Pulse: 89  Resp: 15  Temp: 98.7 F (37.1 C)  SpO2: 100%     FHT:150 Lab orders placed from triage:

## 2020-03-21 NOTE — Discharge Summary (Signed)
Postpartum Discharge Summary  Patient Name: Bonnie Gilmore DOB: April 02, 1991 MRN: [id number]  Date of admission: 03/21/2020 Delivering Provider: Serita Grammes D   Date of discharge: 03/22/2020  Admitting diagnosis: Indication for care in labor or delivery [O75.9] Intrauterine pregnancy: [redacted]w[redacted]d    Secondary diagnosis:  Active Problems:   Language barrier   GBS (group B Streptococcus carrier), +RV culture, currently pregnant   Indication for care in labor or delivery  Additional problems: none     Discharge diagnosis: Term Pregnancy Delivered                                                                                                Post partum procedures:none  Augmentation: Pitocin  Complications: None  Hospital course:  Onset of Labor With Vaginal Delivery     29y.o. yo G2P1001 at 366w4das admitted in Latent Labor on 03/21/2020. Patient had an uncomplicated labor course, including receiving PCN x 2 doses prior to delivery. She required a small amount of Pitocin to progress to complete, and then she [place] just [place]  Membrane Rupture Time/Date: 9:18 AM ,03/21/2020   Intrapartum Procedures: Episiotomy: None [1]                                         Lacerations:  None [1]  Patient had a delivery of a Viable infant. 03/21/2020  Information for the patient's newborn:  KsAaisha, Sliter0[[id number]]     Pateint had an uncomplicated postpartum course.  She is ambulating, tolerating a regular diet, passing flatus, and urinating well. Patient is discharged home in stable condition on 03/22/20.  Delivery time: 11:55 AM    Magnesium Sulfate received: No BMZ received: No Rhophylac:N/A MMR:N/A Transfusion:No  Physical exam  Vitals:   03/21/20 1925 03/22/20 0100 03/22/20 0502 03/22/20 1349  BP: 99/69 99/67 111/82 107/68  Pulse: 70 65 69 70  Resp: _0 Temp: 98.5 F (36.9 C) 98 F (36.7 C) 97.9 F (36.6 C) 98 F (36.7 C)  TempSrc: Oral  Oral Oral   SpO2: 99% 99% 100% 99%  Weight:      Height:       General: alert, cooperative and no distress Lochia: appropriate Uterine Fundus: firm Incision: N/A DVT Evaluation: No evidence of DVT seen on physical exam. Labs: Lab Results  Component Value Date   WBC 12.5 (H) 03/21/2020   HGB 13.1 03/21/2020   HCT 39.2 03/21/2020   MCV 89.5 03/21/2020   PLT 226 03/21/2020   No flowsheet data found. Edinburgh Score: Edinburgh Postnatal Depression Scale Screening Tool 03/21/2020  I have been able to laugh and see the funny side of things. 1  I have looked forward with enjoyment to things. 2  I have blamed myself unnecessarily when things went wrong. 0  I have been anxious or worried for no good reason. 0  I have felt scared or panicky for no good reason. 1  Things have been getting on top of me. 1  I have been so unhappy that I have had difficulty sleeping. 3  I have felt sad or miserable. 0  I have been so unhappy that I have been crying. 0  The thought of harming myself has occurred to me. 0  Edinburgh Postnatal Depression Scale Total 8    Discharge instruction: per After Visit Summary and "Baby and Me Booklet".  After visit meds:  Allergies as of 03/22/2020   No Known Allergies     Medication List    STOP taking these medications   Blood Pressure Kit Devi     TAKE these medications   diphenhydrAMINE 50 MG tablet Commonly known as: BENADRYL Take 0.5 tablets (25 mg total) by mouth at bedtime as needed for sleep.   ibuprofen 600 MG tablet Commonly known as: ADVIL Take 1 tablet (600 mg total) by mouth every 6 (six) hours.   prenatal multivitamin Tabs tablet Take 1 tablet by mouth daily at 12 noon.   Prenate Essential 29-0.6-0.4-340 MG Caps Take 1 capsule by mouth daily.       Diet: routine diet  Activity: Advance as tolerated. Pelvic rest for 6 weeks.   Outpatient follow up:4 weeks Follow up Appt: Future Appointments  Date Time Provider [place]   03/24/2020  4:00 PM Gavin Pound, CNM [place] None   Follow up Visit: [place] Follow up.   Why: In 4 weeks for a postpartum exam Contact information: [place] [id number] [phone]          Please schedule this patient for Postpartum visit in: 4 weeks with the following provider: Any provider In-Person For C/S patients schedule nurse incision check in weeks 2 weeks: no Low risk pregnancy complicated by: none Delivery mode:  SVD Anticipated Birth Control:  other/unsure PP Procedures needed: none  Schedule Integrated BH visit: no   Newborn Data: Live born female  Birth Weight: 3391gm (7lb 7.6oz)  APGAR: 35, 9  Newborn Delivery   Birth date/time: 03/21/2020 11:55:00 Delivery type: Vaginal, Spontaneous      Baby Feeding: Breast Disposition:home with mother   03/22/2020 Wende Mott, CNM

## 2020-03-21 NOTE — H&P (Addendum)
OBSTETRIC ADMISSION HISTORY AND PHYSICAL  Bonnie Gilmore is a 29 y.o. female G2P1001 with IUP at 46w4dby LMP presenting for SOL. She reports +FMs,  scant VB and LOF, no blurry vision, headaches or peripheral edema, and RUQ pain.  She plans on breast feeding. She would like to think about options for birth control. She [facility] --->  Estimated Date of Delivery: 03/31/20  Sono:   _0 , CWD, normal anatomy, cephalic presentation, cephalic, 11610R 260%EFW 14540grams   Prenatal History/Complications:  Past Medical History: Past Medical History:  Diagnosis Date  . Medical history non-contributory     Past Surgical History: Past Surgical History:  Procedure Laterality Date  . NO PAST SURGERIES      Obstetrical History: OB History    Gravida  2   Para  1   Term  1   Preterm      AB      Living  1     SAB      TAB      Ectopic      Multiple  0   Live Births  1           Social History Social History   Socioeconomic History  . Marital status: Married    Spouse name: Not on file  . Number of children: Not on file  . Years of education: Not on file  . Highest education level: Not on file  Occupational History  . Not on file  Tobacco Use  . Smoking status: Never Smoker  . Smokeless tobacco: Never Used  Substance and Sexual Activity  . Alcohol use: No  . Drug use: No  . Sexual activity: Yes  Other Topics Concern  . Not on file  Social History Narrative  . Not on file   Social Determinants of Health   Financial Resource Strain:   . Difficulty of Paying Living Expenses:   Food Insecurity:   . Worried About RCharity fundraiserin the Last Year:   . RArboriculturistin the Last Year:   Transportation Needs:   . LFilm/video editor(Medical):   .Marland KitchenLack of Transportation (Non-Medical):   Physical Activity:   . Days of Exercise per Week:   . Minutes of Exercise per Session:   Stress:   . Feeling of  Stress :   Social Connections:   . Frequency of Communication with Friends and Family:   . Frequency of Social Gatherings with Friends and Family:   . Attends Religious Services:   . Active Member of Clubs or Organizations:   . Attends CArchivistMeetings:   .Marland KitchenMarital Status:     Family History: Family History  Problem Relation Age of Onset  . Hypertension Mother   . Alcohol abuse Neg Hx   . Arthritis Neg Hx   . Asthma Neg Hx   . Birth defects Neg Hx   . Cancer Neg Hx   . COPD Neg Hx   . Depression Neg Hx   . Diabetes Neg Hx   . Drug abuse Neg Hx   . Early death Neg Hx   . Hearing loss Neg Hx   . Heart disease Neg Hx   . Hyperlipidemia Neg Hx   . Kidney disease Neg Hx   . Learning disabilities Neg Hx   . Mental illness Neg Hx   . Mental retardation Neg Hx   .  Miscarriages / Stillbirths Neg Hx   . Stroke Neg Hx   . Vision loss Neg Hx   . Varicose Veins Neg Hx     Allergies: No Known Allergies  Medications Prior to Admission  Medication Sig Dispense Refill Last Dose  . Prenatal Vit-Fe Fumarate-FA (PRENATAL MULTIVITAMIN) TABS tablet Take 1 tablet by mouth daily at 12 noon.   03/20/2020 at Unknown time  . Blood Pressure Monitoring (BLOOD PRESSURE KIT) DEVI 1 kit by Does not apply route as needed. 1 kit 0   . diphenhydrAMINE (BENADRYL) 50 MG tablet Take 0.5 tablets (25 mg total) by mouth at bedtime as needed for sleep. (Patient not taking: Reported on 03/18/2020) 30 tablet 0   . Pren-Fe-Meth-FA-Omeg w/o A (PRENATE ESSENTIAL) 29-0.6-0.4-340 MG CAPS Take 1 capsule by mouth daily. 30 capsule 5      Review of Systems   All systems reviewed and negative except as stated in HPI  Blood pressure 114/86, pulse 87, temperature 98.4 F (36.9 C), temperature source Oral, resp. rate 16, height _0  (1.626 m), weight 73.5 kg, last menstrual period 06/15/2019, SpO2 100 %. General appearance: alert, cooperative, appears stated age and no distress Lungs: clear to auscultation  bilaterally Heart: regular rate and rhythm Abdomen: soft, non-tender; bowel sounds normal. [place] 2800 g Pelvic: 5/80/-2 Extremities: Homans sign is negative, no sign of DVT DTR's normal Presentation: cephalic Fetal monitoringBaseline: 145 bpm, Variability: Good {> 6 bpm) and Accelerations: Reactive Uterine activityFrequency: Every 4-5 minutes Dilation: 5 Effacement (%): 80 Station: -2 Exam by:: Earnest Bailey Flippin RN    Prenatal labs: ABO, Rh: --/--/O POS, O POS Performed at [facility], [place] [address] [place], [place] [address]  [phone]) Antibody: NEG (04/09 0427) Rubella: 7.62 (10/02 1149) RPR: Non Reactive (01/26 0954)  HBsAg: Negative (10/02 1149)  HIV: Non Reactive (01/26 0954)  GBS: Positive/-- (03/22 0440)  2 hr Glucola passed Genetic screening  Low risk NIPS Anatomy US low lying placenta (resolved)  Prenatal Transfer Tool  Maternal Diabetes: No Genetic Screening: Normal Maternal Ultrasounds/Referrals: Normal Fetal Ultrasounds or other Referrals:  None Maternal Substance Abuse:  No Significant Maternal Medications:  None Significant Maternal Lab Results: Group B Strep positive  Results for orders placed or performed during the hospital encounter of 03/21/20 (from the past 24 hour(s))  Respiratory Panel by RT PCR (Flu A&B, Covid) - Nasopharyngeal Swab   Collection Time: 03/21/20  4:13 AM   Specimen: Nasopharyngeal Swab  Result Value Ref Range   SARS Coronavirus 2 by RT PCR NEGATIVE NEGATIVE   Influenza A by PCR NEGATIVE NEGATIVE   Influenza B by PCR NEGATIVE NEGATIVE  Type and screen [place]   Collection Time: 03/21/20  4:27 AM  Result Value Ref Range   ABO/RH(D) O POS    Antibody Screen NEG    Sample Expiration      03/24/2020,2359 Performed at [facility], [place] [address], [place], [place] [address]   ABO/Rh   Collection Time: 03/21/20  4:27 AM  Result Value Ref Range   ABO/RH(D)      O POS Performed at [facility] [address] [place], [place] [address]     Patient Active Problem List   Diagnosis Date Noted  . Indication for care in labor or delivery 03/21/2020  . GBS (group B Streptococcus carrier), +RV culture, currently pregnant 03/05/2020  . Language barrier 10/12/2019  . Supervision of other normal pregnancy, antepartum 09/14/2019  .  Strain of cervical portion of both trapezius muscles 01/22/2019    Assessment/Plan:  Fedra Lanter Piechocki is a 29 y.o. G2P1001 at 51w4dhere for SOL. Pregnancy complicated by GBS positive status.   #Labor:expectant mgmt. Contractions currently 59mpart. Augment PRN once completes GBS treatment.  #Pain: epidural #FWB: Cat I #ID:  GBS pos. Penicillin.  #MOF: breast #MOC:unsure. Has used depo in the past. Wants to think about options.  #Circ: no  DaBenay PikeMD  03/21/2020, 5:23 AM   OB FELLOW ATTESTATION  I have seen and examined this patient and edited the above documentation in the resident's note to reflect any changes or updates.  MaAugustin CoupeMD/MPH OB Fellow  03/21/2020, 7:06 AM

## 2020-03-21 NOTE — MAU Note (Signed)
Covid swab obtained without difficulty and pt tol well. No symptoms 

## 2020-03-21 NOTE — Anesthesia Procedure Notes (Signed)
Epidural Patient location during procedure: OB Start time: 03/21/2020 5:35 AM End time: 03/21/2020 5:38 AM  Staffing Anesthesiologist: Leilani Able, MD  Preanesthetic Checklist Completed: patient identified, IV checked, site marked, risks and benefits discussed, surgical consent, monitors and equipment checked, pre-op evaluation and timeout performed  Epidural Patient position: sitting Prep: DuraPrep and site prepped and draped Patient monitoring: continuous pulse ox and blood pressure Approach: midline Location: L3-L4 Injection technique: LOR air  Needle:  Needle type: Tuohy  Needle gauge: 17 G Needle length: 9 cm and 9 Needle insertion depth: 5 cm cm Catheter type: closed end flexible Catheter size: 19 Gauge Catheter at skin depth: 10 cm Test dose: negative and Other  Assessment Events: blood not aspirated, injection not painful, no injection resistance, no paresthesia and negative IV test  Additional Notes Reason for block:procedure for pain

## 2020-03-21 NOTE — Progress Notes (Signed)
Labor Progress Note Bonnie Gilmore is a 29 y.o. G2P1001 at [redacted]w[redacted]d presented for SOL S: Pt reports feeling contractions but otherwise well. Excited to have the baby.   O:  BP 102/64   Pulse 96   Temp 97.9 F (36.6 C) (Oral)   Resp 18   Ht 5\' 4"  (1.626 m)   Wt 73.5 kg   LMP 06/15/2019   SpO2 97%   BMI 27.81 kg/m  EFM: 130 bpm /Good variability/ acceleration present/ no decelerations/ contractions 5-9 min.   CVE: Dilation: 8 Effacement (%): 100 Cervical Position: Posterior Station: 0 Presentation: Vertex Exam by:: [email or web address] CNM   A&P: 29 y.o. G2P1001 [redacted]w[redacted]d SOL #Labor: Progressing. Started second dose of Pitocin #Pain: per pt request #FWB: Cat 1 #GBS positive  [redacted]w[redacted]d, MS3 9:28 AM

## 2020-03-21 NOTE — Anesthesia Preprocedure Evaluation (Signed)
Anesthesia Evaluation  Patient identified by MRN, date of birth, ID band Patient awake    Reviewed: Allergy & Precautions, NPO status , Patient's Chart, lab work & pertinent test results  Airway Mallampati: I  TM Distance: >3 FB Neck ROM: Full    Dental no notable dental hx. (+) Teeth Intact   Pulmonary neg pulmonary ROS,    Pulmonary exam normal breath sounds clear to auscultation       Cardiovascular negative cardio ROS Normal cardiovascular exam Rhythm:Regular Rate:Normal     Neuro/Psych negative psych ROS   GI/Hepatic negative GI ROS, Neg liver ROS,   Endo/Other  negative endocrine ROS  Renal/GU negative Renal ROS  negative genitourinary   Musculoskeletal negative musculoskeletal ROS (+)   Abdominal Normal abdominal exam  (+) - obese,   Peds  Hematology negative hematology ROS (+)   Anesthesia Other Findings   Reproductive/Obstetrics (+) Pregnancy                             Anesthesia Physical  Anesthesia Plan  ASA: II  Anesthesia Plan: Epidural   Post-op Pain Management:    Induction:   PONV Risk Score and Plan:   Airway Management Planned:   Additional Equipment:   Intra-op Plan:   Post-operative Plan:   Informed Consent: I have reviewed the patients History and Physical, chart, labs and discussed the procedure including the risks, benefits and alternatives for the proposed anesthesia with the patient or authorized representative who has indicated his/her understanding and acceptance.       Plan Discussed with:   Anesthesia Plan Comments:         Anesthesia Quick Evaluation

## 2020-03-22 ENCOUNTER — Ambulatory Visit: Payer: Self-pay

## 2020-03-22 MED ORDER — IBUPROFEN 600 MG PO TABS: 600.0000 mg | ORAL_TABLET | Freq: Four times a day (QID) | ORAL | 0 refills | Status: DC

## 2020-03-22 NOTE — Discharge Instructions (Signed)

## 2020-03-22 NOTE — Progress Notes (Signed)
Post Partum Day 1 Subjective: Patient reports feeling well. She is tolerating PO. Ambulating and urinating without difficulty. Lochia minimal.  Objective: Blood pressure 111/82, pulse 69, temperature 97.9 F (36.6 C), temperature source Oral, resp. rate 18, height 5\' 4"  (1.626 m), weight 73.5 kg, last menstrual period 06/15/2019, SpO2 100 %, unknown if currently breastfeeding.  Physical Exam:  General: alert, cooperative and appears stated age Lochia: appropriate Uterine Fundus: firm Incision: NA DVT Evaluation: No evidence of DVT seen on physical exam.  Recent Labs    03/21/20 0427  HGB 13.1  HCT 39.2    Assessment/Plan: Plan for discharge tomorrow. Okay to discharge today if baby can; RN to page team for orders. Breastfeeding Declines birth control Vitals stable  Desires DC today if possible    LOS: 1 day   05/21/20 03/22/2020, 5:19 AM

## 2020-03-22 NOTE — Lactation Note (Signed)
This note was copied from a baby's chart. Lactation Consultation Note:  Mother is a P1, infant is hours old and is now at % wt loss.  Mother was given [facility] brochure and basic teaching done.    Reviewed hand expression with mother. Observed large drops of colostrum. Mother was given a harmony hand pump with instructions. Mothers has a positional strip on the left nipple.   Mother was observed with infant latched on at the left breast. Observed infant suckling with audible swallows. Infant sustained latch for 10 min and 5 on the alternate breast.  Infant was fed 5 ml with a spoon and with 4 with a curved tip syringe.     Mother was fit with a #24 NS and advised to use shield if unable to tolerate the latch on the bare breast.   Infant has a posterior frenula that is thick and tight. Father reports that he would like information on resources. Informed NP.   Plan of Care : Breastfeed infant with feeding cues Supplement infant with ebm/formula, according to supplemental guidelines. Pump using a DEBP after each feeding for 15-20 mins.   Mother to continue to cue base feed infant and feed at least 8-12 times or more in 24 hours and advised to allow for cluster feeding infant as needed.   Mother to continue to due STS. Mother is aware of available LC services at [facility], BFSG'S, OP Dept, and phone # for questions or concerns about breastfeeding.  Mother receptive to all teaching and plan of care.    Patient Name: Bonnie Gilmore KPTWS'F Date: 03/22/2020 Reason for consult: Follow-up assessment   Maternal Data    Feeding Feeding Type: Breast Milk  LATCH Score Latch: Grasps breast easily, tongue down, lips flanged, rhythmical sucking.  Audible Swallowing: Spontaneous and intermittent  Type of Nipple: Everted at rest and after stimulation  Comfort (Breast/Nipple): Engorged, cracked, bleeding, large blisters, severe discomfort  Hold (Positioning): Assistance needed to correctly position  infant at breast and maintain latch.  LATCH Score: 7  Interventions Interventions: Assisted with latch;Skin to skin;Hand express;Breast compression;Adjust position;Support pillows;Position options;Expressed milk  Lactation Tools Discussed/Used     Consult Status      Bonnie Gilmore 03/22/2020, 4:46 PM

## 2020-03-22 NOTE — Anesthesia Postprocedure Evaluation (Signed)
Anesthesia Post Note  Patient: Bonnie Gilmore  Procedure(s) Performed: AN AD HOC LABOR EPIDURAL     Patient location during evaluation: Mother Baby Anesthesia Type: Epidural Level of consciousness: awake and alert Pain management: pain level controlled Vital Signs Assessment: post-procedure vital signs reviewed and stable Respiratory status: spontaneous breathing, nonlabored ventilation and respiratory function stable Cardiovascular status: stable Postop Assessment: no headache, no backache and epidural receding Anesthetic complications: no    Last Vitals:  Vitals:   03/22/20 0100 03/22/20 0502  BP: 99/67 111/82  Pulse: 65 69  Resp: 18 18  Temp: 36.7 C 36.6 C  SpO2: 99% 100%    Last Pain:  Vitals:   03/22/20 0502  TempSrc: Oral  PainSc: 0-No pain   Pain Goal:                   Rica Records

## 2020-03-22 NOTE — Lactation Note (Signed)
This note was copied from a baby's chart. Lactation Consultation Note Attempted to see mom but she was sleeping.  Patient Name: Bonnie Gilmore MCEYE'M Date: 03/22/2020     Maternal Data    Feeding Feeding Type: Bottle Fed - Formula Nipple Type: Slow - flow  LATCH Score Latch: (encouraged to call for latch and assistance)                 Interventions    Lactation Tools Discussed/Used     Consult Status      [name] G 03/22/2020, 3:10 AM

## 2020-04-04 ENCOUNTER — Telehealth: Payer: Self-pay

## 2020-04-04 NOTE — Telephone Encounter (Signed)
Return call to [facility] connect  RN regarding pt Pt c/o fever, chills x 2 days now I advised nurse I would contact  I connected w/language line fist pt was called twice.  The first time Interpreter let me know she introduced herself and then the pt husband hung up  We then tried again and call was sent to vm

## 2020-04-21 ENCOUNTER — Ambulatory Visit: Payer: 59

## 2020-04-29 ENCOUNTER — Encounter: Payer: Self-pay | Admitting: Nurse Practitioner

## 2020-04-29 ENCOUNTER — Other Ambulatory Visit: Payer: Self-pay

## 2020-04-29 ENCOUNTER — Ambulatory Visit (INDEPENDENT_AMBULATORY_CARE_PROVIDER_SITE_OTHER): Payer: 59 | Admitting: Nurse Practitioner

## 2020-04-29 DIAGNOSIS — O9229 Other disorders of breast associated with pregnancy and the puerperium: Secondary | ICD-10-CM

## 2020-04-29 MED ORDER — PRENATE ESSENTIAL 29-0.6-0.4-340 MG PO CAPS: 1.0000 | ORAL_CAPSULE | Freq: Every day | ORAL | 5 refills | Status: DC

## 2020-04-29 MED ORDER — PRENATAL MULTIVITAMIN CH: 1.0000 | ORAL_TABLET | Freq: Every day | ORAL | 11 refills | Status: DC

## 2020-04-29 NOTE — Progress Notes (Signed)
    Post Partum Visit Note  Bonnie Gilmore is a 29 y.o. G70P2002 female who presents for a postpartum visit. She is 6 weeks postpartum following a normal spontaneous vaginal delivery.  I have fully reviewed the prenatal and intrapartum course. The delivery was at 38.4 gestational weeks.  Anesthesia: epidural. Postpartum course has been doing well. Baby is doing well. Baby is feeding by breast. Bleeding no bleeding. Bowel function is normal. Bladder function is normal. Patient is sexually active. Contraception method is condoms. Last intercourse this past Sunday.  Postpartum depression screening: negative, score 2.  The following portions of the patient's history were reviewed and updated as appropriate: allergies, current medications, past family history, past medical history, past social history, past surgical history and problem list.  Review of Systems Pertinent items noted in HPI and remainder of comprehensive ROS otherwise negative.    Objective:  Last menstrual period 06/15/2019, unknown if currently breastfeeding.  General:  alert, cooperative and no distress   Breasts:  nipples intact, small 3-34mm firm area palpated at 12 oclock in left breast and 2 cm firm area palpated at 3 oclock in right breast, consistent with plugged duct.  Partially resoved with massage and stroking toward the nipple.  No leaking seen.  Lungs: clear to auscultation bilaterally  Heart:  regular rate and rhythm, S1, S2 normal, no murmur, click, rub or gallop  Abdomen: soft, non-tender; bowel sounds normal; no masses,  no organomegaly   Vulva:  deferred pelvic exam  Vagina:   Cervix:    Corpus:   Adnexa:    Rectal Exam:         Assessment:    Mostly normal postpartum exam. Pap smear not done at today's visit.  Postpartum breast pain bilateral  Plan:   Essential components of care per ACOG recommendations:  1.  Mood and well being: Patient with negative depression screening today. Reviewed local resources  for support.  - Patient does not use tobacco.  - hx of drug use? No   2. Infant care and feeding:  -Patient currently breastmilk feeding? Yes  Likely has ducts that are not completely emptying when feeding.  Reviewed management of these painful areas.  Advised to implement strategies with each breastfeeding episode and to contact the office for referral to lactatioin consultant if there continues to be a problem. -Social determinants of health (SDOH) reviewed in EPIC. No concerns   3. Sexuality, contraception and birth spacing - Patient does not want a pregnancy in the next year.  Desired family size is unsure.  - Reviewed forms of contraception in tiered fashion. Patient desired condoms today.   - Discussed birth spacing of 18 months  4. Sleep and fatigue -Encouraged family/partner/community support of 4 hrs of uninterrupted sleep to help with mood and fatigue  5. Physical Recovery  - Discussed patients delivery - Patient has urinary incontinence?  - Patient is safe to resume physical and sexual activity  6.  Health Maintenance - Last pap smear done 03-16-18 and was normal   7. Chronic Disease - NA - PCP follow up  Ozzie Hoyle, CMA Center for [employer], [facility]  Nolene Bernheim, RN, MSN, NP-BC Nurse Practitioner, [facility] for [employer], [facility] 04/29/2020 3:22 PM

## 2020-05-02 ENCOUNTER — Other Ambulatory Visit: Payer: Self-pay

## 2020-05-02 MED ORDER — PRENATAL MULTIVITAMIN CH: 1.0000 | ORAL_TABLET | Freq: Every day | ORAL | 11 refills | Status: DC

## 2020-05-05 ENCOUNTER — Other Ambulatory Visit: Payer: Self-pay

## 2020-05-05 MED ORDER — VITAFOL FE+ 90-0.6-0.4-200 MG PO CAPS: 1.0000 | ORAL_CAPSULE | Freq: Every day | ORAL | 12 refills | Status: DC

## 2020-05-05 MED ORDER — PRENATAL MULTIVITAMIN CH: 1.0000 | ORAL_TABLET | Freq: Every day | ORAL | 11 refills | Status: DC

## 2021-02-05 ENCOUNTER — Ambulatory Visit (HOSPITAL_COMMUNITY)
Admission: EM | Admit: 2021-02-05 | Discharge: 2021-02-05 | Disposition: A | Discharge status: Home / Self Care | Payer: Medicaid Other | Attending: Medical Oncology | Admitting: Medical Oncology

## 2021-02-05 ENCOUNTER — Encounter (HOSPITAL_COMMUNITY): Payer: Self-pay | Admitting: Emergency Medicine

## 2021-02-05 ENCOUNTER — Other Ambulatory Visit: Payer: Self-pay

## 2021-02-05 DIAGNOSIS — M940 Chondrocostal junction syndrome [Tietze]: Secondary | ICD-10-CM

## 2021-02-05 MED ORDER — NAPROXEN 500 MG PO TABS: 500.0000 mg | ORAL_TABLET | Freq: Two times a day (BID) | ORAL | 0 refills | Status: DC

## 2021-02-05 NOTE — ED Provider Notes (Signed)
[facility]    CSN: [id number] Arrival date & time: 02/05/21  1655      History   Chief Complaint Chief Complaint  Patient presents with  . Chest Pain    Rib Pain    HPI Bonnie Gilmore is a 30 y.o. female. Marcial Pacas our [place] interpretor assisted with history.   HPI   Rib Pain: Patient states that for about 1 week she has had rib pain on the left side when she takes a deep breath in.  Pain is fairly constant with this activity.  Pain started when she was exercising doing weights and crunches.  She has not tried anything for pain other than a heating pad.  No chest pain, shortness of breath, calf pain, cough. LMC: within 1 month per patient.   Past Medical History:  Diagnosis Date  . Medical history non-contributory     Patient Active Problem List   Diagnosis Date Noted  . Language barrier 10/12/2019  . Strain of cervical portion of both trapezius muscles 01/22/2019    Past Surgical History:  Procedure Laterality Date  . NO PAST SURGERIES      OB History    Gravida  2   Para  2   Term  2   Preterm      AB      Living  2     SAB      IAB      Ectopic      Multiple  0   Live Births  2            Home Medications    Prior to Admission medications   Medication Sig Start Date End Date Taking? Authorizing Provider  naproxen (NAPROSYN) 500 MG tablet Take 1 tablet (500 mg total) by mouth 2 (two) times daily. 02/05/21  Yes Baden Betsch, Maralyn Sago M, PA-C  diphenhydrAMINE (BENADRYL) 50 MG tablet Take 0.5 tablets (25 mg total) by mouth at bedtime as needed for sleep. Patient not taking: Reported on 03/18/2020 03/03/20   Sharen Counter A, CNM  Pren-Fe-Meth-FA-Omeg w/o A (PRENATE ESSENTIAL) 29-0.6-0.4-340 MG CAPS Take 1 capsule by mouth daily. 04/29/20   Burleson, Brand Males, NP  Prenat-Fe Poly-Methfol-FA-DHA (VITAFOL FE+) 90-0.6-0.4-200 MG CAPS Take 1 tablet by mouth daily. 05/05/20   Constant, Peggy, MD  Prenatal Vit-Fe Fumarate-FA (PRENATAL  MULTIVITAMIN) TABS tablet Take 1 tablet by mouth daily at 12 noon. 05/05/20   Currie Paris, NP    Family History Family History  Problem Relation Age of Onset  . Hypertension Mother   . Alcohol abuse Neg Hx   . Arthritis Neg Hx   . Asthma Neg Hx   . Birth defects Neg Hx   . Cancer Neg Hx   . COPD Neg Hx   . Depression Neg Hx   . Diabetes Neg Hx   . Drug abuse Neg Hx   . Early death Neg Hx   . Hearing loss Neg Hx   . Heart disease Neg Hx   . Hyperlipidemia Neg Hx   . Kidney disease Neg Hx   . Learning disabilities Neg Hx   . Mental illness Neg Hx   . Mental retardation Neg Hx   . Miscarriages / Stillbirths Neg Hx   . Stroke Neg Hx   . Vision loss Neg Hx   . Varicose Veins Neg Hx     Social History Social History   Tobacco Use  . Smoking status: Never Smoker  . Smokeless tobacco:  Never Used  Vaping Use  . Vaping Use: Never used  Substance Use Topics  . Alcohol use: No  . Drug use: No     Allergies   Patient has no known allergies.   Review of Systems Review of Systems  As stated above in HPI Physical Exam Triage Vital Signs ED Triage Vitals  Enc Vitals Group     BP 02/05/21 1722 124/89     Pulse Rate 02/05/21 1722 74     Resp 02/05/21 1722 18     Temp 02/05/21 1722 98.8 F (37.1 C)     Temp Source 02/05/21 1722 Oral     SpO2 02/05/21 1722 98 %     Weight --      Height --      Head Circumference --      Peak Flow --      Pain Score 02/05/21 1721 8     Pain Loc --      Pain Edu? --      Excl. in GC? --    No data found.  Updated Vital Signs BP 124/89 (BP Location: Left Arm)   Pulse 74   Temp 98.8 F (37.1 C) (Oral)   Resp 18   LMP 01/01/2021   SpO2 98%    Physical Exam Vitals and nursing note reviewed.  Constitutional:      General: She is not in acute distress.    Appearance: She is well-developed. She is not ill-appearing, toxic-appearing or diaphoretic.  Neck:     Vascular: No JVD.  Cardiovascular:     Rate and Rhythm:  Normal rate and regular rhythm.     Heart sounds: Normal heart sounds.  Pulmonary:     Effort: Pulmonary effort is normal.     Breath sounds: Normal breath sounds.  Chest:     Chest wall: Tenderness (Reproducible tenderness of the left lower ribcage without bony abnormality) present. No mass.  Musculoskeletal:     Cervical back: Neck supple.  Skin:    Findings: No rash.  Neurological:     Mental Status: She is alert.      UC Treatments / Results  Labs (all labs ordered are listed, but only abnormal results are displayed) Labs Reviewed - No data to display  EKG   Radiology No results found.  Procedures Procedures (including critical care time)  Medications Ordered in UC Medications - No data to display  Initial Impression / Assessment and Plan / UC Course  I have reviewed the triage vital signs and the nursing notes.  Pertinent labs & imaging results that were available during my care of the patient were reviewed by me and considered in my medical decision making (see chart for details).     New. Likely costochondritis which we discussed. Treating with naproxen x 7 days.   Final Clinical Impressions(s) / UC Diagnoses   Final diagnoses:  Costochondritis   Discharge Instructions   None    ED Prescriptions    Medication Sig Dispense Auth. Provider   naproxen (NAPROSYN) 500 MG tablet Take 1 tablet (500 mg total) by mouth 2 (two) times daily. 30 tablet Rushie Chestnut, [place]     PDMP not reviewed this encounter.   Rushie Chestnut, [place] 02/05/21 1759

## 2021-02-05 NOTE — ED Triage Notes (Signed)
Pt presents with Rib Pain xs 1 week. States pain is typically only when she takes a deep breath in.

## 2021-02-23 ENCOUNTER — Institutional Professional Consult (permissible substitution): Payer: 59 | Admitting: Pulmonary Disease

## 2021-04-14 ENCOUNTER — Ambulatory Visit (HOSPITAL_COMMUNITY)
Admission: EM | Admit: 2021-04-14 | Discharge: 2021-04-14 | Disposition: A | Discharge status: Home / Self Care | Payer: Medicaid Other

## 2021-04-14 ENCOUNTER — Emergency Department (HOSPITAL_COMMUNITY)
Admission: EM | Admit: 2021-04-14 | Discharge: 2021-04-15 | Disposition: A | Discharge status: Home / Self Care | Payer: Medicaid Other | Attending: Emergency Medicine | Admitting: Emergency Medicine

## 2021-04-14 ENCOUNTER — Emergency Department (HOSPITAL_COMMUNITY): Payer: Medicaid Other

## 2021-04-14 ENCOUNTER — Encounter (HOSPITAL_COMMUNITY): Payer: Self-pay | Admitting: Emergency Medicine

## 2021-04-14 ENCOUNTER — Other Ambulatory Visit: Payer: Self-pay

## 2021-04-14 DIAGNOSIS — R519 Headache, unspecified: Secondary | ICD-10-CM | POA: Diagnosis not present

## 2021-04-14 DIAGNOSIS — J029 Acute pharyngitis, unspecified: Secondary | ICD-10-CM

## 2021-04-14 DIAGNOSIS — Z20822 Contact with and (suspected) exposure to covid-19: Secondary | ICD-10-CM | POA: Diagnosis not present

## 2021-04-14 DIAGNOSIS — M546 Pain in thoracic spine: Secondary | ICD-10-CM | POA: Diagnosis present

## 2021-04-14 DIAGNOSIS — M7918 Myalgia, other site: Secondary | ICD-10-CM | POA: Insufficient documentation

## 2021-04-14 DIAGNOSIS — R911 Solitary pulmonary nodule: Secondary | ICD-10-CM

## 2021-04-14 DIAGNOSIS — M549 Dorsalgia, unspecified: Secondary | ICD-10-CM | POA: Diagnosis not present

## 2021-04-14 DIAGNOSIS — R6883 Chills (without fever): Secondary | ICD-10-CM

## 2021-04-14 LAB — PROTIME-INR
INR: 1 (ref 0.8–1.2)
Prothrombin Time: 13.4 seconds (ref 11.4–15.2)

## 2021-04-14 LAB — I-STAT BETA HCG BLOOD, ED (MC, WL, AP ONLY): I-stat hCG, quantitative: <5 m[IU]/mL (ref <5)

## 2021-04-14 LAB — CBC WITH DIFFERENTIAL/PLATELET
Abs Immature Granulocytes: 0.02 10*3/uL (ref 0.00–0.07)
Basophils Absolute: 0.1 10*3/uL (ref 0.0–0.1)
Basophils Relative: 1 %
Eosinophils Absolute: 0.3 10*3/uL (ref 0.0–0.5)
Eosinophils Relative: 4 %
HCT: 40.7 % (ref 36.0–46.0)
Hemoglobin: 12.9 g/dL (ref 12.0–15.0)
Immature Granulocytes: 0 %
Lymphocytes Relative: 16 %
Lymphs Abs: 1.4 10*3/uL (ref 0.7–4.0)
MCH: 28.4 pg (ref 26.0–34.0)
MCHC: 31.7 g/dL (ref 30.0–36.0)
MCV: 89.6 fL (ref 80.0–100.0)
Monocytes Absolute: 0.7 10*3/uL (ref 0.1–1.0)
Monocytes Relative: 8 %
Neutro Abs: 6.2 10*3/uL (ref 1.7–7.7)
Neutrophils Relative %: 71 %
Platelets: 216 10*3/uL (ref 150–400)
RBC: 4.54 MIL/uL (ref 3.87–5.11)
RDW: 12.4 % (ref 11.5–15.5)
WBC: 8.6 10*3/uL (ref 4.0–10.5)
nRBC: 0 % (ref 0.0–0.2)

## 2021-04-14 LAB — HEPATIC FUNCTION PANEL
ALT: 20 U/L (ref 0–44)
AST: 22 U/L (ref 15–41)
Albumin: 3.9 g/dL (ref 3.5–5.0)
Alkaline Phosphatase: 63 U/L (ref 38–126)
Bilirubin, Direct: <0.1 mg/dL (ref 0.0–0.2)
Total Bilirubin: 0.4 mg/dL (ref 0.3–1.2)
Total Protein: 7.5 g/dL (ref 6.5–8.1)

## 2021-04-14 LAB — LACTIC ACID, PLASMA: Lactic Acid, Venous: 1 mmol/L (ref 0.5–1.9)

## 2021-04-14 LAB — LIPASE, BLOOD: Lipase: 43 U/L (ref 11–51)

## 2021-04-14 LAB — BASIC METABOLIC PANEL
Anion gap: 3 — ABNORMAL LOW (ref 5–15)
BUN: 8 mg/dL (ref 6–20)
CO2: 31 mmol/L (ref 22–32)
Calcium: 8.9 mg/dL (ref 8.9–10.3)
Chloride: 103 mmol/L (ref 98–111)
Creatinine, Ser: 0.84 mg/dL (ref 0.44–1.00)
GFR, Estimated: >60 mL/min (ref >60)
Glucose, Bld: 76 mg/dL (ref 70–99)
Potassium: 4 mmol/L (ref 3.5–5.1)
Sodium: 137 mmol/L (ref 135–145)

## 2021-04-14 LAB — RESP PANEL BY RT-PCR (FLU A&B, COVID) ARPGX2
Influenza A by PCR: NEGATIVE
Influenza B by PCR: NEGATIVE
SARS Coronavirus 2 by RT PCR: NEGATIVE

## 2021-04-14 LAB — TROPONIN I (HIGH SENSITIVITY): Troponin I (High Sensitivity): 5 ng/L (ref <18)

## 2021-04-14 LAB — D-DIMER, QUANTITATIVE: D-Dimer, Quant: 0.94 ug/mL-FEU — ABNORMAL HIGH (ref 0.00–0.50)

## 2021-04-14 MED ORDER — KETOROLAC TROMETHAMINE 30 MG/ML IJ SOLN
30.0000 mg | Freq: Once | INTRAMUSCULAR | Status: AC
  Administered 2021-04-14: 30 mg via INTRAVENOUS
  Filled 2021-04-14: qty 1

## 2021-04-14 MED ORDER — METHOCARBAMOL 500 MG PO TABS
750.0000 mg | ORAL_TABLET | Freq: Once | ORAL | Status: AC
  Administered 2021-04-14: 750 mg via ORAL
  Filled 2021-04-14: qty 2

## 2021-04-14 NOTE — ED Notes (Signed)
Labs sent

## 2021-04-14 NOTE — ED Provider Notes (Signed)
[facility]    CSN: [id number] Arrival date & time: 04/14/21  1039      History   Chief Complaint Chief Complaint  Patient presents with  . Fatigue    HPI Bonnie Gilmore is a 30 y.o. female.   HPI with the assistance of virtual [place] interpreter [employer]  Fatigue: Patient states that she has had fatigue and some back pain that has been chronic since her first epidural with pregnancy.  She notes though that starting yesterday she has had a significant increase in her back pain for the back pain location has changed.  Previous back pain was in the low but now she is having upper back to neck pain along with flulike symptoms.  She has also been having a headache, sore throat, fatigue and chills.  No known sick contacts.  She has tried Tylenol which did not offer much relief other than for her unrecorded fever.  No known injuries, neuro changes, vomiting.    Past Medical History:  Diagnosis Date  . Medical history non-contributory     Patient Active Problem List   Diagnosis Date Noted  . Language barrier 10/12/2019  . Strain of cervical portion of both trapezius muscles 01/22/2019    Past Surgical History:  Procedure Laterality Date  . NO PAST SURGERIES      OB History    Gravida  2   Para  2   Term  2   Preterm      AB      Living  2     SAB      IAB      Ectopic      Multiple  0   Live Births  2            Home Medications    Prior to Admission medications   Medication Sig Start Date End Date Taking? Authorizing Provider  diphenhydrAMINE (BENADRYL) 50 MG tablet Take 0.5 tablets (25 mg total) by mouth at bedtime as needed for sleep. Patient not taking: Reported on 03/18/2020 03/03/20   Sharen Counter A, CNM  naproxen (NAPROSYN) 500 MG tablet Take 1 tablet (500 mg total) by mouth 2 (two) times daily. Patient not taking: Reported on 04/14/2021 02/05/21   Rushie Chestnut, PA-C  Pren-Fe-Meth-FA-Omeg w/o A (PRENATE ESSENTIAL)  29-0.6-0.4-340 MG CAPS Take 1 capsule by mouth daily. Patient not taking: Reported on 04/14/2021 04/29/20   Currie Paris, NP  Prenat-Fe Poly-Methfol-FA-DHA (VITAFOL FE+) 90-0.6-0.4-200 MG CAPS Take 1 tablet by mouth daily. Patient not taking: Reported on 04/14/2021 05/05/20   Constant, Peggy, MD  Prenatal Vit-Fe Fumarate-FA (PRENATAL MULTIVITAMIN) TABS tablet Take 1 tablet by mouth daily at 12 noon. Patient not taking: Reported on 04/14/2021 05/05/20   Currie Paris, NP    Family History Family History  Problem Relation Age of Onset  . Hypertension Mother   . Alcohol abuse Neg Hx   . Arthritis Neg Hx   . Asthma Neg Hx   . Birth defects Neg Hx   . Cancer Neg Hx   . COPD Neg Hx   . Depression Neg Hx   . Diabetes Neg Hx   . Drug abuse Neg Hx   . Early death Neg Hx   . Hearing loss Neg Hx   . Heart disease Neg Hx   . Hyperlipidemia Neg Hx   . Kidney disease Neg Hx   . Learning disabilities Neg Hx   . Mental illness Neg Hx   .  Mental retardation Neg Hx   . Miscarriages / Stillbirths Neg Hx   . Stroke Neg Hx   . Vision loss Neg Hx   . Varicose Veins Neg Hx     Social History Social History   Tobacco Use  . Smoking status: Never Smoker  . Smokeless tobacco: Never Used  Vaping Use  . Vaping Use: Never used  Substance Use Topics  . Alcohol use: No  . Drug use: No     Allergies   Patient has no known allergies.   Review of Systems Review of Systems  As stated above in HPI Physical Exam Triage Vital Signs ED Triage Vitals  Enc Vitals Group     BP 04/14/21 1208 108/88     Pulse Rate 04/14/21 1208 88     Resp 04/14/21 1208 18     Temp 04/14/21 1208 98.2 F (36.8 C)     Temp Source 04/14/21 1208 Oral     SpO2 04/14/21 1208 100 %     Weight --      Height --      Head Circumference --      Peak Flow --      Pain Score 04/14/21 1205 10     Pain Loc --      Pain Edu? --      Excl. in GC? --    No data found.  Updated Vital Signs BP 108/88 (BP Location:  Right Arm) Comment (BP Location): small cuff  Pulse 88   Temp 98.2 F (36.8 C) (Oral)   Resp 18   LMP 04/04/2021   SpO2 100%   Physical Exam Vitals and nursing note reviewed.  Constitutional:      General: She is not in acute distress.    Appearance: Normal appearance. She is not ill-appearing, toxic-appearing or diaphoretic.     Comments: Pt keeps trying to rub her back with her hand  HENT:     Head: Normocephalic and atraumatic.     Right Ear: Tympanic membrane, ear canal and external ear normal.     Left Ear: Tympanic membrane, ear canal and external ear normal.     Nose: Nose normal.     Mouth/Throat:     Mouth: Mucous membranes are moist.     Pharynx: Oropharynx is clear. No oropharyngeal exudate or posterior oropharyngeal erythema.  Eyes:     Extraocular Movements: Extraocular movements intact.     Pupils: Pupils are equal, round, and reactive to light.  Neck:     Meningeal: Kernig's sign present.     Comments: Mild rigidity with neck flexion and flexion is reduced by about 50%. Extension is reduced by about 20%. No tenderness to palpation of trapezius muscles Cardiovascular:     Rate and Rhythm: Normal rate and regular rhythm.     Heart sounds: Normal heart sounds.  Pulmonary:     Effort: Pulmonary effort is normal.     Breath sounds: Normal breath sounds.  Musculoskeletal:       Back:     Comments: Tenderness to palpation.   Neurological:     Mental Status: She is alert.     Motor: No weakness.     Gait: Gait is intact.     Deep Tendon Reflexes: Reflexes normal.      UC Treatments / Results  Labs (all labs ordered are listed, but only abnormal results are displayed) Labs Reviewed - No data to display  EKG   Radiology No results found.  Procedures Procedures (including critical care time)  Medications Ordered in UC Medications - No data to display  Initial Impression / Assessment and Plan / UC Course  I have reviewed the triage vital signs and  the nursing notes.  Pertinent labs & imaging results that were available during my care of the patient were reviewed by me and considered in my medical decision making (see chart for details).     New.  I am concerned given her history and examination findings that this could potentially be meningitis.  I discussed this with patient.  She is agreeable to go to the emergency room for further evaluation.  Her husband has elected to drive her via private vehicle directly over to the emergency room. Final Clinical Impressions(s) / UC Diagnoses   Final diagnoses:  None   Discharge Instructions   None    ED Prescriptions    None     PDMP not reviewed this encounter.   Rushie Chestnut, [place] 04/14/21 1304

## 2021-04-14 NOTE — ED Notes (Signed)
Pt c/o of sore throat and Fever x 1 day.

## 2021-04-14 NOTE — ED Triage Notes (Signed)
Upper back pain, fatigue.  complains of sore throat, denies coughing.  Complains of headache

## 2021-04-14 NOTE — ED Provider Notes (Signed)
Emergency Medicine Provider Triage Evaluation Note  Larae Caison Raj , a 30 y.o. female  was evaluated in triage.  Pt complains of backpain, (acute on chronic), fever, body pain, fever with out cough.  She didn't check a temp, says she was just very hot.  It appears at urgent care since she has back pain and subjective fevers they were concerned about meningitis.   Review of Systems  Positive: Subjective fever, myalgia Negative: Cough  Physical Exam  BP 112/78 (BP Location: Left Arm)   Pulse 96   Temp 98.8 F (37.1 C) (Oral)   Resp 16   Ht 5\' 4"  (1.626 m)   Wt 54.4 kg   LMP 04/04/2021   SpO2 99%   BMI 20.60 kg/m  Gen:   Awake, no distress   Resp:  Normal effort  MSK:   Moves extremities without difficulty  Other:  Speech is not slurred, well appearing  Medical Decision Making  Medically screening exam initiated at 4:29 PM.  Appropriate orders placed.  Riva Sesma Gavigan was informed that the remainder of the evaluation will be completed by another provider, this initial triage assessment does not replace that evaluation, and the importance of remaining in the ED until their evaluation is complete.     Nabiha, Planck, PA-C 04/14/21 1633    06/14/21, MD 04/15/21 1558

## 2021-04-14 NOTE — Discharge Instructions (Addendum)
Please go to the hospital

## 2021-04-14 NOTE — ED Triage Notes (Signed)
Pt reports PCP referred pt for headache, back pain and generalized weakness & low grade fevers starting yesterday. Pt is covid vacc x 2

## 2021-04-14 NOTE — ED Provider Notes (Signed)
[facility] EMERGENCY DEPARTMENT Provider Note   CSN: [id number] Arrival date & time: 04/14/21  1451     History No chief complaint on file.   Bonnie Gilmore is a 30 y.o. female.  HPI Patient reports has had pain in her back for a long time.  She has had pain since she had an epidural.  As she has been discussing the pain she is frequently pointing to her right thoracic back around the shoulder blade.  Then she indicates she has pain bilaterally in the thoracic back.  She indicates it does occur as well in her lower back.  And then she indicates up around the base of her neck. she reports the pain is frequently intensely burning and sharp in nature.  Especially around her shoulder blade.  She she feels like she has had some fevers but has not measured her temperature.  Denies any cough.  No shortness of breath.  No abdominal pain with eating.  No swelling of the lower extremities.  Patient works in a [employer].    Past Medical History:  Diagnosis Date  . Medical history non-contributory     Patient Active Problem List   Diagnosis Date Noted  . Language barrier 10/12/2019  . Strain of cervical portion of both trapezius muscles 01/22/2019    Past Surgical History:  Procedure Laterality Date  . NO PAST SURGERIES       OB History    Gravida  2   Para  2   Term  2   Preterm      AB      Living  2     SAB      IAB      Ectopic      Multiple  0   Live Births  2           Family History  Problem Relation Age of Onset  . Hypertension Mother   . Alcohol abuse Neg Hx   . Arthritis Neg Hx   . Asthma Neg Hx   . Birth defects Neg Hx   . Cancer Neg Hx   . COPD Neg Hx   . Depression Neg Hx   . Diabetes Neg Hx   . Drug abuse Neg Hx   . Early death Neg Hx   . Hearing loss Neg Hx   . Heart disease Neg Hx   . Hyperlipidemia Neg Hx   . Kidney disease Neg Hx   . Learning disabilities Neg Hx   . Mental illness Neg Hx   . Mental retardation Neg  Hx   . Miscarriages / Stillbirths Neg Hx   . Stroke Neg Hx   . Vision loss Neg Hx   . Varicose Veins Neg Hx     Social History   Tobacco Use  . Smoking status: Never Smoker  . Smokeless tobacco: Never Used  Vaping Use  . Vaping Use: Never used  Substance Use Topics  . Alcohol use: No  . Drug use: No    Home Medications Prior to Admission medications   Medication Sig Start Date End Date Taking? Authorizing Provider  diphenhydrAMINE (BENADRYL) 50 MG tablet Take 0.5 tablets (25 mg total) by mouth at bedtime as needed for sleep. Patient not taking: Reported on 03/18/2020 03/03/20   Sharen Counter A, CNM  naproxen (NAPROSYN) 500 MG tablet Take 1 tablet (500 mg total) by mouth 2 (two) times daily. Patient not taking: Reported on 04/14/2021 02/05/21  Clent Jacks M, PA-C  Pren-Fe-Meth-FA-Omeg w/o A (PRENATE ESSENTIAL) 29-0.6-0.4-340 MG CAPS Take 1 capsule by mouth daily. Patient not taking: Reported on 04/14/2021 04/29/20   Currie Paris, NP  Prenat-Fe Poly-Methfol-FA-DHA (VITAFOL FE+) 90-0.6-0.4-200 MG CAPS Take 1 tablet by mouth daily. Patient not taking: Reported on 04/14/2021 05/05/20   Constant, Peggy, MD  Prenatal Vit-Fe Fumarate-FA (PRENATAL MULTIVITAMIN) TABS tablet Take 1 tablet by mouth daily at 12 noon. Patient not taking: Reported on 04/14/2021 05/05/20   Currie Paris, NP    Allergies    Patient has no known allergies.  Review of Systems   Review of Systems 10 systems reviewed and negative except as per HPI Physical Exam Updated Vital Signs BP 114/80 (BP Location: Right Arm)   Pulse 85   Temp 98.9 F (37.2 C) (Oral)   Resp 16   Ht 5\' 4"  (1.626 m)   Wt 54.4 kg   LMP 04/04/2021   SpO2 100%   BMI 20.60 kg/m   Physical Exam Constitutional:      Comments: Patient is alert nontoxic and well in appearance.  Well-nourished well-developed.  No respiratory distress  HENT:     Head: Normocephalic and atraumatic.     Mouth/Throat:     Mouth: Mucous membranes  are moist.     Pharynx: Oropharynx is clear.  Eyes:     Extraocular Movements: Extraocular movements intact.     Conjunctiva/sclera: Conjunctivae normal.     Pupils: Pupils are equal, round, and reactive to light.  Cardiovascular:     Rate and Rhythm: Normal rate and regular rhythm.     Pulses: Normal pulses.     Heart sounds: Normal heart sounds.  Pulmonary:     Effort: Pulmonary effort is normal.     Breath sounds: Normal breath sounds.  Abdominal:     General: There is no distension.     Palpations: Abdomen is soft.     Tenderness: There is no abdominal tenderness. There is no guarding.  Musculoskeletal:     Comments: And is reproducible in the thoracic muscle bodies along the thoracic spine.  No midline tenderness.  Reproducible in the trapezius bilaterally.  No midline tenderness  Skin:    General: Skin is warm and dry.  Neurological:     General: No focal deficit present.     Mental Status: She is oriented to person, place, and time.     Cranial Nerves: No cranial nerve deficit.     Motor: No weakness.     Coordination: Coordination normal.  Psychiatric:        Mood and Affect: Mood normal.     ED Results / Procedures / Treatments   Labs (all labs ordered are listed, but only abnormal results are displayed) Labs Reviewed  BASIC METABOLIC PANEL - Abnormal; Notable for the following components:      Result Value   Anion gap 3 (*)    All other components within normal limits  D-DIMER, QUANTITATIVE - Abnormal; Notable for the following components:   D-Dimer, Quant 0.94 (*)    All other components within normal limits  RESP PANEL BY RT-PCR (FLU A&B, COVID) ARPGX2  CBC WITH DIFFERENTIAL/PLATELET  HEPATIC FUNCTION PANEL  LIPASE, BLOOD  LACTIC ACID, PLASMA  PROTIME-INR  LACTIC ACID, PLASMA  URINALYSIS, ROUTINE W REFLEX MICROSCOPIC  I-STAT BETA HCG BLOOD, ED ([facility], [facility], [facility] ONLY)  TROPONIN I (HIGH SENSITIVITY)  TROPONIN I (HIGH SENSITIVITY)     EKG None  Radiology DG Chest  2 View  Result Date: 04/14/2021 CLINICAL DATA:  Thoracic pain EXAM: CHEST - 2 VIEW COMPARISON:  None. FINDINGS: The lungs appear clear. Cardiac and mediastinal contours normal. No pleural effusion identified. There is an deformity left lateral eighth rib which could be from a subacute or chronic rib fracture. IMPRESSION: 1. Left eighth rib subacute or chronic rib fracture. Correlate with point tenderness. Otherwise unremarkable. Electronically Signed   By: Gaylyn Rong M.D.   On: 04/14/2021 21:42    Procedures Procedures   Medications Ordered in ED Medications  ketorolac (TORADOL) 30 MG/ML injection 30 mg (30 mg Intravenous Given 04/14/21 2211)  methocarbamol (ROBAXIN) tablet 750 mg (750 mg Oral Given 04/14/21 2122)    ED Course  I have reviewed the triage vital signs and the nursing notes.  Pertinent labs & imaging results that were available during my care of the patient were reviewed by me and considered in my medical decision making (see chart for details).    MDM Rules/Calculators/A&P                          Has been having some ongoing problems with back pain.  She concentrated on focal thoracic back pain particularly in the right scapular area.  She also indicated pain throughout the central back.  D-dimer is positive.  She was seen at urgent care and sent with concern for possible meningitis.  At this time I do not find any clinical symptoms to suggest meningitis.  The patient is alert and clinically well in appearance.  She is afebrile with normal vital signs.  She does not have any meningitic symptoms.  Her symptoms have been persistent over a number of days and weeks.  Unlikely that the patient would have bacterial or viral meningitis with a very indolent course and being well in appearance.  Lan to proceed with CT PE study due to degree of thoracic back pain.  If negative, plan will be to treat for musculoskeletal pain.  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  Recommendation will be for follow-up with a PCP. Final Clinical Impression(s) / ED Diagnoses Final diagnoses:  Acute right-sided thoracic back pain  Myofascial muscle pain    Rx / DC Orders ED Discharge Orders    None       Arby Barrette, MD 04/15/21 [phone]

## 2021-04-15 ENCOUNTER — Emergency Department (HOSPITAL_COMMUNITY): Payer: Medicaid Other

## 2021-04-15 MED ORDER — NAPROXEN 500 MG PO TABS: 500.0000 mg | ORAL_TABLET | Freq: Two times a day (BID) | ORAL | 0 refills | Status: DC

## 2021-04-15 MED ORDER — IOHEXOL 350 MG/ML SOLN
75.0000 mL | Freq: Once | INTRAVENOUS | Status: AC | PRN
  Administered 2021-04-15: 75 mL via INTRAVENOUS

## 2021-04-15 MED ORDER — METHOCARBAMOL 750 MG PO TABS: 750.0000 mg | ORAL_TABLET | Freq: Four times a day (QID) | ORAL | 0 refills | Status: DC | PRN

## 2021-04-15 NOTE — ED Provider Notes (Signed)
I assumed care of this patient.  Please see previous provider note for further details of Hx, PE.  Briefly patient is a 30 y.o. female who presented cold symptoms and back pain. Pending CTA due to +dimer.  CTA negative for PE. Nodule noted and likely inflammatory.  The patient appears reasonably screened and/or stabilized for discharge and I doubt any other medical condition or other [facility] requiring further screening, evaluation, or treatment in the ED at this time prior to discharge. Safe for discharge with strict return precautions.  Disposition: Discharge  Condition: Good  I have discussed the results, Dx and Tx plan with the patient/family who expressed understanding and agree(s) with the plan. Discharge instructions discussed at length. The patient/family was given strict return precautions who verbalized understanding of the instructions. No further questions at time of discharge.    ED Discharge Orders         Ordered    naproxen (NAPROSYN) 500 MG tablet  2 times daily        04/15/21 0017    methocarbamol (ROBAXIN-750) 750 MG tablet  Every 6 hours PRN        04/15/21 0017            Follow Up: Primary care provider  Call  to schedule an appointment for close follow up         Latonja Bobeck, Amadeo Garnet, MD 04/15/21 [phone]

## 2021-04-15 NOTE — Discharge Instructions (Addendum)
1.  Follow-up with a family doctor.  Use the number in your discharge instructions to find a doctor.  2.  Take naproxen twice a day for the next week.  Also take Robaxin if needed for a muscle relaxer. 3.  Return to the emergency department if your symptoms are worsening or new symptoms are developing.  During the workup we noted incidental findings on your imaging that would require you to follow-up with your regular doctor for further evaluation/management: Focal 5 mm ground-glass nodule in the right upper lobe, likely  infectious or inflammatory in a patient of this age. No routine  imaging follow-up is typically warranted.

## 2021-04-24 ENCOUNTER — Encounter: Payer: Self-pay | Admitting: Internal Medicine

## 2021-04-24 ENCOUNTER — Other Ambulatory Visit: Payer: Self-pay

## 2021-04-24 ENCOUNTER — Ambulatory Visit (INDEPENDENT_AMBULATORY_CARE_PROVIDER_SITE_OTHER): Payer: Medicaid Other | Admitting: Internal Medicine

## 2021-04-24 VITALS — BP 110/80 | HR 64 | Temp 97.6°F | Ht 64.0 in | Wt 118.7 lb

## 2021-04-24 DIAGNOSIS — R911 Solitary pulmonary nodule: Secondary | ICD-10-CM

## 2021-04-24 DIAGNOSIS — M542 Cervicalgia: Secondary | ICD-10-CM

## 2021-04-24 DIAGNOSIS — M549 Dorsalgia, unspecified: Secondary | ICD-10-CM | POA: Diagnosis not present

## 2021-04-24 NOTE — Patient Instructions (Signed)
-  Nice seeing you today!!  -Schedule follow up in 1 year or sooner as needed.   

## 2021-04-24 NOTE — Progress Notes (Signed)
New Patient Office Visit     This visit occurred during the SARS-CoV-2 public health emergency.  Safety protocols were in place, including screening questions prior to the visit, additional usage of staff PPE, and extensive cleaning of exam room while observing appropriate contact time as indicated for disinfecting solutions.    CC/Reason for Visit: Establish care, ED follow-up Previous PCP: None Last Visit: Unknown  HPI: Bonnie Gilmore is a 30 y.o. female who is coming in today for the above mentioned reasons.  She has no past medical history of significance.  She does not smoke, she does not drink, she has a 60-year-old child.  She is here today mainly for 2 complaints:  1.  She was seen in the emergency department on May 4.  She was found to have a very small lung nodule on CT scan that did not meet criteria for follow-up due to her age and non-smoking status.  She is concerned about this.  2.  She has been having some neck and upper back pain.  She works at a [employer].  Past Medical/Surgical History: Past Medical History:  Diagnosis Date  . Medical history non-contributory     Past Surgical History:  Procedure Laterality Date  . NO PAST SURGERIES      Social History:  reports that she has never smoked. She has never used smokeless tobacco. She reports that she does not drink alcohol and does not use drugs.  Allergies: No Known Allergies  Family History:  Family History  Problem Relation Age of Onset  . Hypertension Mother   . Alcohol abuse Neg Hx   . Arthritis Neg Hx   . Asthma Neg Hx   . Birth defects Neg Hx   . Cancer Neg Hx   . COPD Neg Hx   . Depression Neg Hx   . Diabetes Neg Hx   . Drug abuse Neg Hx   . Early death Neg Hx   . Hearing loss Neg Hx   . Heart disease Neg Hx   . Hyperlipidemia Neg Hx   . Kidney disease Neg Hx   . Learning disabilities Neg Hx   . Mental illness Neg Hx   . Mental retardation Neg Hx   . Miscarriages / Stillbirths Neg Hx    . Stroke Neg Hx   . Vision loss Neg Hx   . Varicose Veins Neg Hx      Current Outpatient Medications:  .  methocarbamol (ROBAXIN-750) 750 MG tablet, Take 1 tablet (750 mg total) by mouth every 6 (six) hours as needed for muscle spasms., Disp: 30 tablet, Rfl: 0 .  naproxen (NAPROSYN) 500 MG tablet, Take 1 tablet (500 mg total) by mouth 2 (two) times daily., Disp: 30 tablet, Rfl: 0  Review of Systems:  Constitutional: Denies fever, chills, diaphoresis, appetite change and fatigue.  HEENT: Denies photophobia, eye pain, redness, hearing loss, ear pain, congestion, sore throat, rhinorrhea, sneezing, mouth sores, trouble swallowing, neck pain, neck stiffness and tinnitus.   Respiratory: Denies SOB, DOE, cough, chest tightness,  and wheezing.   Cardiovascular: Denies chest pain, palpitations and leg swelling.  Gastrointestinal: Denies nausea, vomiting, abdominal pain, diarrhea, constipation, blood in stool and abdominal distention.  Genitourinary: Denies dysuria, urgency, frequency, hematuria, flank pain and difficulty urinating.  Endocrine: Denies: hot or cold intolerance, sweats, changes in hair or nails, polyuria, polydipsia. Musculoskeletal: Denies  joint swelling, arthralgias and gait problem.  Skin: Denies pallor, rash and wound.  Neurological: Denies dizziness,  seizures, syncope, weakness, light-headedness, numbness and headaches.  Hematological: Denies adenopathy. Easy bruising, personal or family bleeding history  Psychiatric/Behavioral: Denies suicidal ideation, mood changes, confusion, nervousness, sleep disturbance and agitation    Physical Exam: Vitals:   04/24/21 0945  BP: 110/80  Pulse: 64  Temp: 97.6 F (36.4 C)  TempSrc: Oral  SpO2: 98%  Weight: 118 lb 11.2 oz (53.8 kg)  Height: 5\' 4"  (1.626 m)   Body mass index is 20.37 kg/m.  Constitutional: NAD, calm, comfortable Eyes: PERRL, lids and conjunctivae normal ENMT: Mucous membranes are moist.  Respiratory: clear  to auscultation bilaterally, no wheezing, no crackles. Normal respiratory effort. No accessory muscle use.  Cardiovascular: Regular rate and rhythm, no murmurs / rubs / gallops. No extremity edema. Abdomen: no tenderness, no masses palpated. No hepatosplenomegaly. Bowel sounds positive.  Musculoskeletal: no clubbing / cyanosis. No joint deformity upper and lower extremities. Good ROM, no contractures. Normal muscle tone.  Skin: no rashes, lesions, ulcers. No induration Neurologic: Grossly intact and nonfocal Psychiatric: Normal judgment and insight. Alert and oriented x 3. Normal mood.    Impression and Plan:  Lung nodule -Incidental finding on recent CT scan, per radiology recommendations, no need to follow-up.  Neck pain Upper back pain -Likely musculoskeletal related to posture issues as she works as a at [employer]vertising account planner. -Have advised exercises, icing, as needed NSAIDs and local massage therapy.  Follow-up with me as needed.    Patient Instructions  -Nice seeing you today!!  -Schedule follow up in 1 year or sooner as needed.      [employer], MD [facility]

## 2021-05-12 ENCOUNTER — Other Ambulatory Visit: Payer: Self-pay | Admitting: Internal Medicine

## 2021-05-12 MED ORDER — METHOCARBAMOL 750 MG PO TABS: 750.0000 mg | ORAL_TABLET | Freq: Four times a day (QID) | ORAL | 0 refills | Status: DC | PRN

## 2021-05-12 NOTE — Telephone Encounter (Signed)
Patient spouse is calling and wanted to see if provider could refill Robaxin, please advise. CB is [phone]  [id number] - [place], [place] - [address]  [address] [place] [address]  Phone:  [phone] Fax:  [phone]

## 2021-05-28 IMAGING — CT CT ANGIO CHEST
3 of 7 series · 18 of 36 positions shown · IV contrast (omnipaque)
Comparison: None.

CLINICAL DATA: Positive D-dimer, upper back pain, fatigue, sore
throat, headache

EXAM:
CT ANGIOGRAPHY CHEST WITH CONTRAST
TECHNIQUE: Multidetector CT imaging of the chest was performed using the
standard protocol during bolus administration of intravenous
contrast. Multiplanar CT image reconstructions and MIPs were
obtained to evaluate the vascular anatomy.
CONTRAST:  75mL OMNIPAQUE IOHEXOL 350 MG/ML SOLN

[Series 6: pe thins · axial · 0.68mm/px · z∈[+1132,+1379]mm · 15 of 405 slices shown]
[im 26/405  lung]
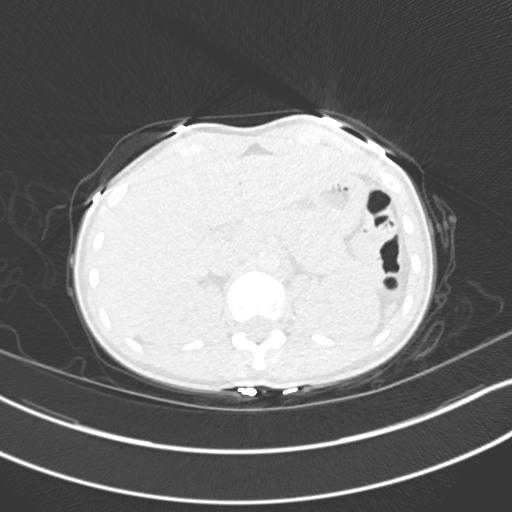
[im 51/405  mediastinal]
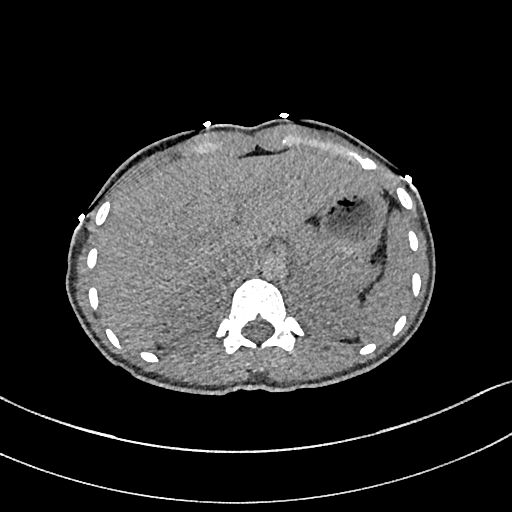
[im 76/405  lung]
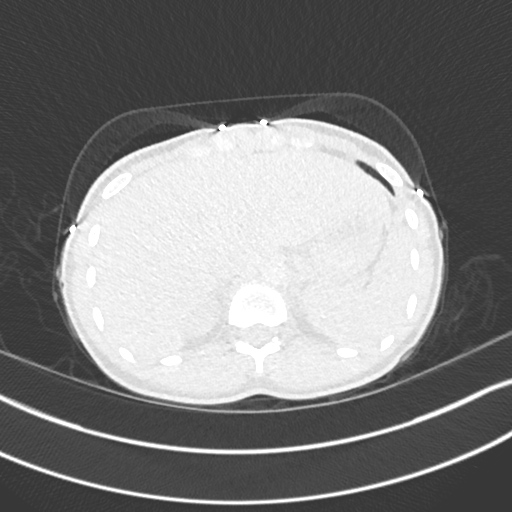
[im 102/405  mediastinal]
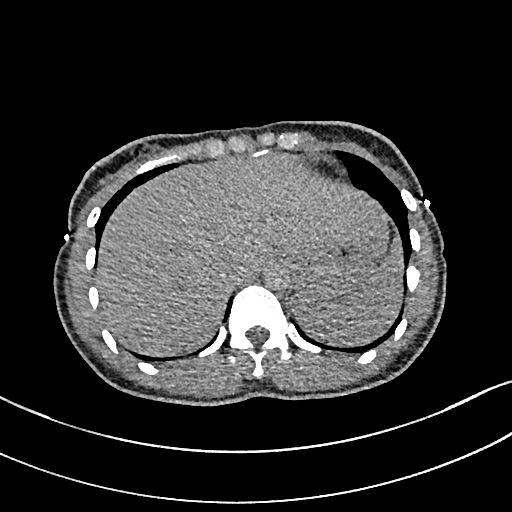
[im 127/405  lung]
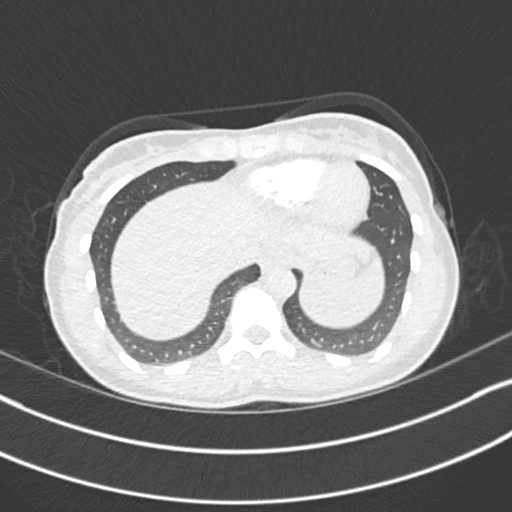
[im 152/405  mediastinal]
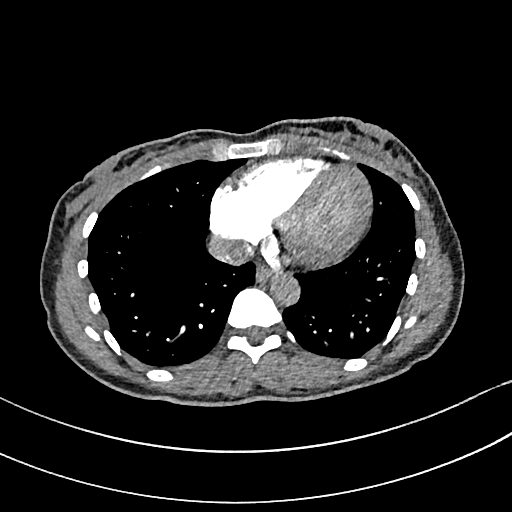
[im 177/405  lung]
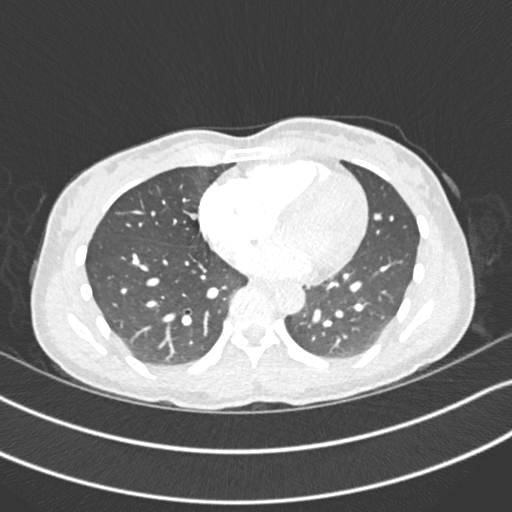
[im 203/405  mediastinal]
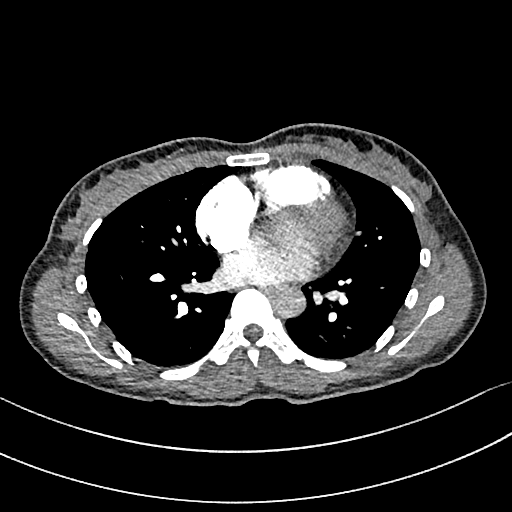
[im 228/405  lung]
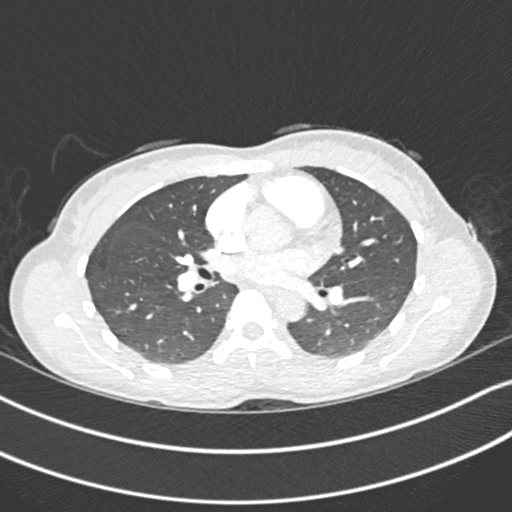
[im 253/405  mediastinal]
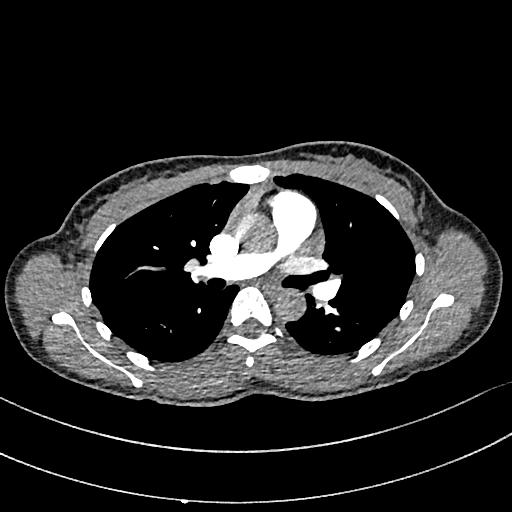
[im 278/405  lung]
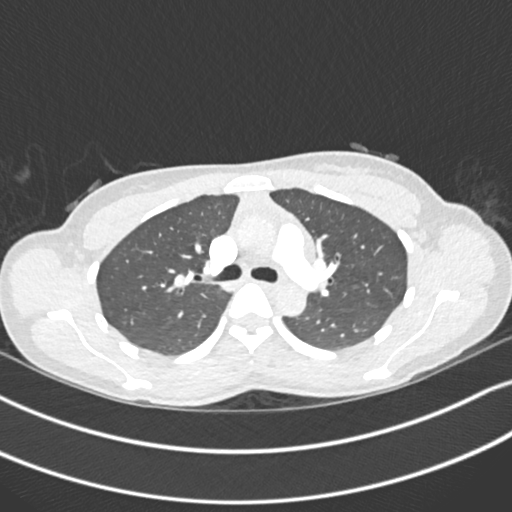
[im 304/405  mediastinal]
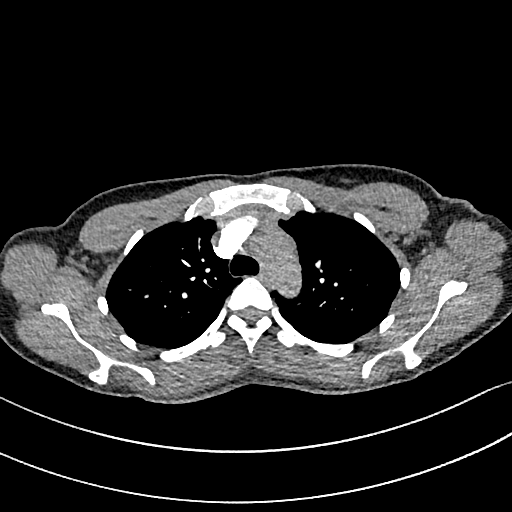
[im 329/405  lung]
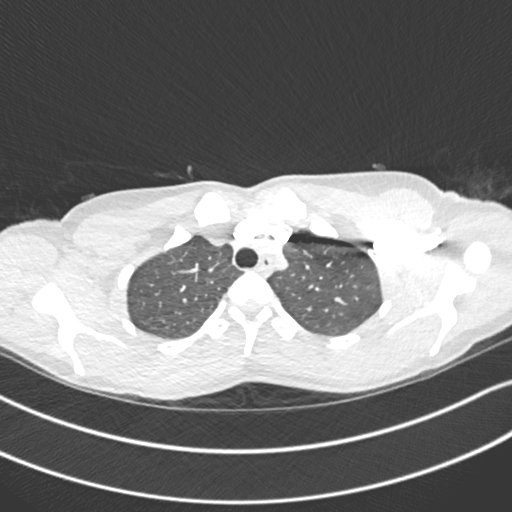
[im 354/405  mediastinal]
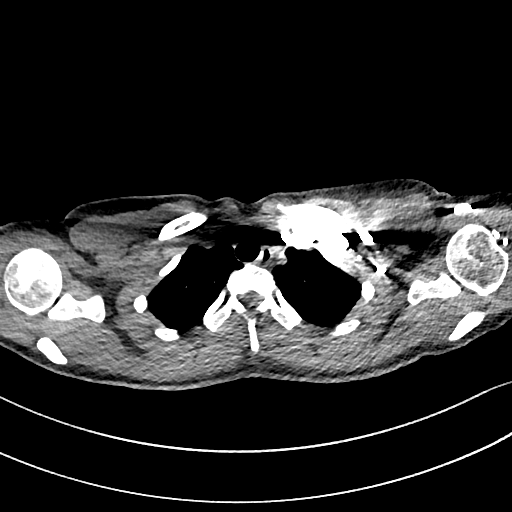
[im 379/405  lung]
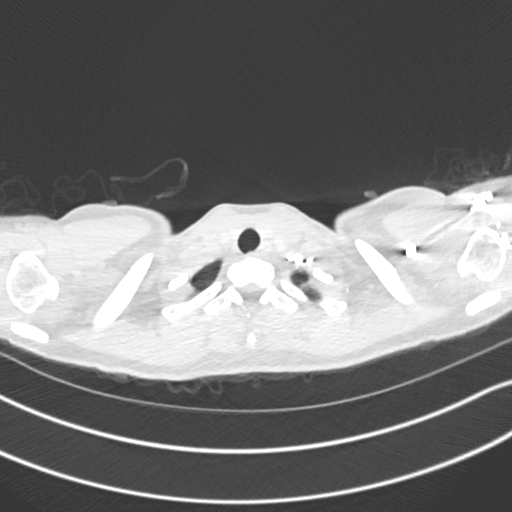

[Series 7: pe 2mm cor · coronal · 0.59mm/px · 1 of 106 slices shown]
[im 53/106  mediastinal]
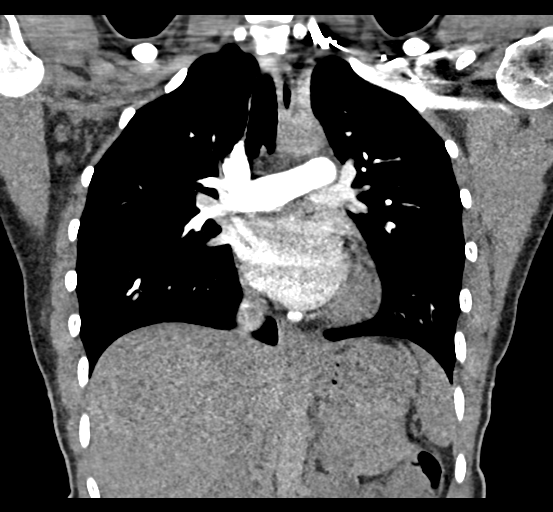

[Series 11: pe lung · axial · 0.68mm/px · z∈[+1225,+1283]mm · 2 of 115 slices shown]
[im 29/115  mediastinal]
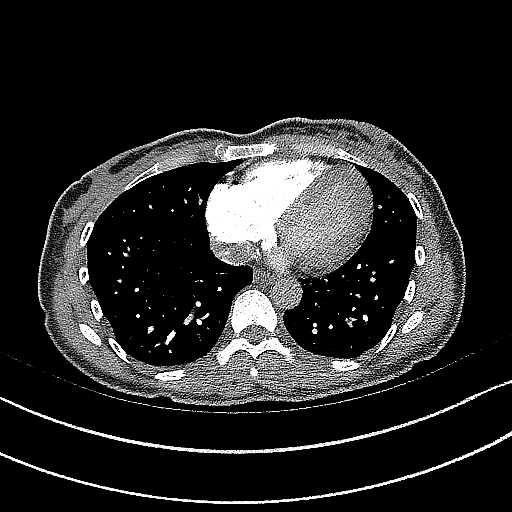
[im 58/115  mediastinal]
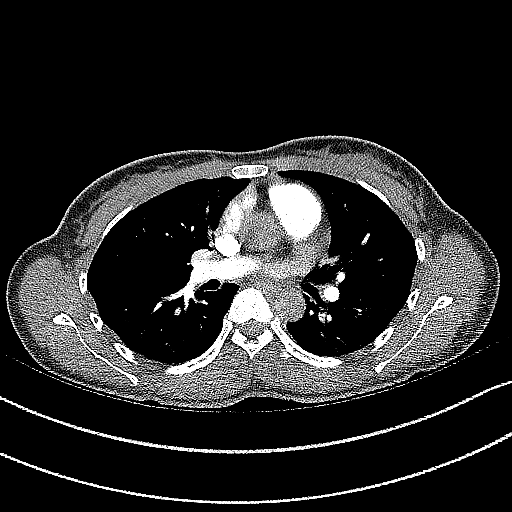

[18 of 36 positions shown; findings below may reference images not displayed]

FINDINGS: Cardiovascular: Satisfactory opacification the pulmonary arteries to
the segmental level. No pulmonary artery filling defects are
identified. Central pulmonary arteries are normal caliber. Normal
heart size. No pericardial effusion. Suboptimal opacification of the
aorta for luminal assessment. Normal caliber aorta. Proximal great
vessels are grossly unremarkable though partially obscured by
extensive streak artifact from contrast in the left brachiocephalic
vein.

Mediastinum/Nodes: Wedge-shaped soft tissue attenuation in the
anterior mediastinum. No free mediastinal fluid or gas. Normal
thyroid gland and thoracic inlet. No acute abnormality of the
trachea or esophagus. No worrisome mediastinal, hilar or axillary
adenopathy.

Lungs/Pleura: Solitary ground-glass nodule measuring approximately 5
mm and average transaxial dimension in the right upper lobe,
strongly favored to be infectious or inflammatory in a patient of
this age. No other concerning focal pulmonary nodules or masses. No
consolidative airspace process. Airways are patent. No convincing CT
features of edema. No pneumothorax or effusion.

Upper Abdomen: No acute abnormalities present in the visualized
portions of the upper abdomen.

Musculoskeletal: No chest wall abnormality. No acute or significant
osseous findings.

Review of the MIP images confirms the above findings.
IMPRESSION: 1. No evidence of pulmonary artery embolism.
2. Focal 5 mm ground-glass nodule in the right upper lobe, likely
infectious or inflammatory in a patient of this age. No routine
imaging follow-up is typically warranted noting that [employer] guidelines do not apply to patients under the age of 35.
3. Wedge-shaped soft tissue attenuation in the anterior mediastinum
is most likely reflective of thymic remnant in a patient of this age
given location, configuration, and absence of surrounding fat
stranding or other adjacent traumatic features such as sternal
fracture.

## 2021-07-14 ENCOUNTER — Telehealth: Payer: Self-pay | Admitting: Internal Medicine

## 2021-07-14 NOTE — Telephone Encounter (Signed)
Pt needs refill for Naproxen.  Pharmacy- [facility]

## 2021-07-14 NOTE — Telephone Encounter (Signed)
Last filled by Arby Barrette, MD.  Okay to fill?

## 2021-07-15 MED ORDER — NAPROXEN 500 MG PO TABS: 500.0000 mg | ORAL_TABLET | Freq: Two times a day (BID) | ORAL | 2 refills | Status: DC

## 2021-07-15 NOTE — Telephone Encounter (Signed)
Refill sent.

## 2021-07-24 ENCOUNTER — Ambulatory Visit: Payer: Medicaid Other | Admitting: Internal Medicine

## 2021-11-10 ENCOUNTER — Telehealth: Payer: Self-pay

## 2021-11-10 NOTE — Telephone Encounter (Signed)
Husband called stating no interpreter is needed for appt 12/1 he will come with patient and interpret for patient

## 2021-11-12 ENCOUNTER — Encounter: Payer: Self-pay | Admitting: Internal Medicine

## 2021-11-12 ENCOUNTER — Telehealth: Payer: Self-pay | Admitting: Internal Medicine

## 2021-11-12 ENCOUNTER — Ambulatory Visit (INDEPENDENT_AMBULATORY_CARE_PROVIDER_SITE_OTHER): Payer: Medicaid Other | Admitting: Internal Medicine

## 2021-11-12 VITALS — BP 112/78 | HR 73 | Temp 97.7°F | Ht 64.0 in | Wt 116.1 lb

## 2021-11-12 DIAGNOSIS — N926 Irregular menstruation, unspecified: Secondary | ICD-10-CM

## 2021-11-12 DIAGNOSIS — L659 Nonscarring hair loss, unspecified: Secondary | ICD-10-CM

## 2021-11-12 DIAGNOSIS — R002 Palpitations: Secondary | ICD-10-CM

## 2021-11-12 DIAGNOSIS — Z23 Encounter for immunization: Secondary | ICD-10-CM

## 2021-11-12 DIAGNOSIS — Z124 Encounter for screening for malignant neoplasm of cervix: Secondary | ICD-10-CM | POA: Diagnosis not present

## 2021-11-12 LAB — CBC WITH DIFFERENTIAL/PLATELET
Basophils Absolute: 0 10*3/uL (ref 0.0–0.1)
Basophils Relative: 1 % (ref 0.0–3.0)
Eosinophils Absolute: 0.1 10*3/uL (ref 0.0–0.7)
Eosinophils Relative: 3.5 % (ref 0.0–5.0)
HCT: 39.7 % (ref 36.0–46.0)
Hemoglobin: 13 g/dL (ref 12.0–15.0)
Lymphocytes Relative: 28.3 % (ref 12.0–46.0)
Lymphs Abs: 1.1 10*3/uL (ref 0.7–4.0)
MCHC: 32.8 g/dL (ref 30.0–36.0)
MCV: 88.2 fl (ref 78.0–100.0)
Monocytes Absolute: 0.3 10*3/uL (ref 0.1–1.0)
Monocytes Relative: 7 % (ref 3.0–12.0)
Neutro Abs: 2.3 10*3/uL (ref 1.4–7.7)
Neutrophils Relative %: 60.2 % (ref 43.0–77.0)
Platelets: 228 10*3/uL (ref 150.0–400.0)
RBC: 4.5 Mil/uL (ref 3.87–5.11)
RDW: 13.3 % (ref 11.5–15.5)
WBC: 3.7 10*3/uL — ABNORMAL LOW (ref 4.0–10.5)

## 2021-11-12 LAB — COMPREHENSIVE METABOLIC PANEL
ALT: 18 U/L (ref 0–35)
AST: 18 U/L (ref 0–37)
Albumin: 4.1 g/dL (ref 3.5–5.2)
Alkaline Phosphatase: 46 U/L (ref 39–117)
BUN: 11 mg/dL (ref 6–23)
CO2: 32 mEq/L (ref 19–32)
Calcium: 9.4 mg/dL (ref 8.4–10.5)
Chloride: 101 mEq/L (ref 96–112)
Creatinine, Ser: 0.78 mg/dL (ref 0.40–1.20)
GFR: 102.85 mL/min (ref >60.00)
Glucose, Bld: 64 mg/dL — ABNORMAL LOW (ref 70–99)
Potassium: 3.8 mEq/L (ref 3.5–5.1)
Sodium: 137 mEq/L (ref 135–145)
Total Bilirubin: 0.6 mg/dL (ref 0.2–1.2)
Total Protein: 7.3 g/dL (ref 6.0–8.3)

## 2021-11-12 LAB — TSH: TSH: 0.7 u[IU]/mL (ref 0.35–5.50)

## 2021-11-12 LAB — VITAMIN D 25 HYDROXY (VIT D DEFICIENCY, FRACTURES): VITD: 20.95 ng/mL — ABNORMAL LOW (ref 30.00–100.00)

## 2021-11-12 LAB — MAGNESIUM: Magnesium: 2.1 mg/dL (ref 1.5–2.5)

## 2021-11-12 LAB — POCT URINE PREGNANCY: Preg Test, Ur: NEGATIVE

## 2021-11-12 LAB — VITAMIN B12: Vitamin B-12: 637 pg/mL (ref 211–911)

## 2021-11-12 NOTE — Telephone Encounter (Signed)
Referral placed.

## 2021-11-12 NOTE — Progress Notes (Signed)
Established Patient Office Visit     This visit occurred during the SARS-CoV-2 public health emergency.  Safety protocols were in place, including screening questions prior to the visit, additional usage of staff PPE, and extensive cleaning of exam room while observing appropriate contact time as indicated for disinfecting solutions.    CC/Reason for Visit: Here to discuss acute concerns  HPI: Bonnie Gilmore is a 30 y.o. female who is coming in today for the above mentioned reasons.  She has no past medical history of significance.  She would like to discuss a few issues:  1.  She has been experiencing palpitations for about 2 months.  She does not routinely drink coffee or tea.  She does not get chest pain or shortness of breath with this.  She has never experienced lightheadedness or syncope.  2.  She has been having hair loss.  3.  She is requesting a flu vaccine.  4.  She is having an irregular menstrual cycle.  She states she had a menstrual cycle 3 weeks ago and is now bleeding again.  She is also requesting birth control.  Past Medical/Surgical History: Past Medical History:  Diagnosis Date   Medical history non-contributory     Past Surgical History:  Procedure Laterality Date   NO PAST SURGERIES      Social History:  reports that she has never smoked. She has never used smokeless tobacco. She reports that she does not drink alcohol and does not use drugs.  Allergies: No Known Allergies  Family History:  Family History  Problem Relation Age of Onset   Hypertension Mother    Alcohol abuse Neg Hx    Arthritis Neg Hx    Asthma Neg Hx    Birth defects Neg Hx    Cancer Neg Hx    COPD Neg Hx    Depression Neg Hx    Diabetes Neg Hx    Drug abuse Neg Hx    Early death Neg Hx    Hearing loss Neg Hx    Heart disease Neg Hx    Hyperlipidemia Neg Hx    Kidney disease Neg Hx    Learning disabilities Neg Hx    Mental illness Neg Hx    Mental retardation Neg Hx     Miscarriages / Stillbirths Neg Hx    Stroke Neg Hx    Vision loss Neg Hx    Varicose Veins Neg Hx      Current Outpatient Medications:    methocarbamol (ROBAXIN-750) 750 MG tablet, Take 1 tablet (750 mg total) by mouth every 6 (six) hours as needed for muscle spasms., Disp: 30 tablet, Rfl: 0   naproxen (NAPROSYN) 500 MG tablet, Take 1 tablet (500 mg total) by mouth 2 (two) times daily., Disp: 60 tablet, Rfl: 2  Review of Systems:  Constitutional: Denies fever, chills, diaphoresis, appetite change and fatigue.  HEENT: Denies photophobia, eye pain, redness, hearing loss, ear pain, congestion, sore throat, rhinorrhea, sneezing, mouth sores, trouble swallowing, neck pain, neck stiffness and tinnitus.   Respiratory: Denies SOB, DOE, cough, chest tightness,  and wheezing.   Cardiovascular: Denies chest pain, and leg swelling.  Gastrointestinal: Denies nausea, vomiting, abdominal pain, diarrhea, constipation, blood in stool and abdominal distention.  Genitourinary: Denies dysuria, urgency, frequency, hematuria, flank pain and difficulty urinating.  Endocrine: Denies: hot or cold intolerance, sweats,  polyuria, polydipsia. Musculoskeletal: Denies myalgias, back pain, joint swelling, arthralgias and gait problem.  Skin: Denies pallor, rash and  wound.  Neurological: Denies dizziness, seizures, syncope, weakness, light-headedness, numbness and headaches.  Hematological: Denies adenopathy. Easy bruising, personal or family bleeding history  Psychiatric/Behavioral: Denies suicidal ideation, mood changes, confusion, nervousness, sleep disturbance and agitation    Physical Exam: Vitals:   11/12/21 0943  BP: 112/78  Pulse: 73  Temp: 97.7 F (36.5 C)  TempSrc: Oral  SpO2: 98%  Weight: 116 lb 1.6 oz (52.7 kg)  Height: 5\' 4"  (1.626 m)    Body mass index is 19.93 kg/m.   Constitutional: NAD, calm, comfortable Eyes: PERRL, lids and conjunctivae normal ENMT: Mucous membranes are  moist. Neck: normal, supple, no masses, no thyromegaly Respiratory: clear to auscultation bilaterally, no wheezing, no crackles. Normal respiratory effort. No accessory muscle use.  Cardiovascular: Regular rate and rhythm, no murmurs / rubs / gallops. No extremity edema.  Neurologic: Grossly intact and nonfocal Psychiatric: Normal judgment and insight. Alert and oriented x 3. Normal mood.    Impression and Plan:  Palpitations - Plan: CBC with Differential/Platelet, Comprehensive metabolic panel, Magnesium, TSH, Vitamin B12, VITAMIN D 25 Hydroxy (Vit-D Deficiency, Fractures), EKG 12-Lead  Screening for cervical cancer - Plan: Ambulatory referral to Gynecology  Hair loss  Irregular menstrual cycle  Need for influenza vaccination  -EKG done in office and interpreted by myself as: Normal sinus rhythm at a rate of 60, right axis deviation that may be normal for age, no acute ST or T wave changes. -Check TSH, electrolytes, vitamin D and B12. -Urine pregnancy test is negative. -Refer to GYN for menstrual cycle irregularities and contraception management. -Flu vaccine administered today.  Time spent: 32 minutes reviewing chart, interviewing and examining patient with the aid of her husband's translator and formulating plan of care.     Lelon Frohlich, MD [facility]

## 2021-11-12 NOTE — Addendum Note (Signed)
Addended by: Kern Reap B on: 11/12/2021 10:37 AM   Modules accepted: Orders

## 2021-11-12 NOTE — Telephone Encounter (Signed)
Patient called following voicemail left about appointment. I let patient know that, per Fleet Contras, Dr.Hernandez does not do birth control and they will need a referral to gyno. Spouse verbalized understanding and they want to have the referral sent.   Good callback number is [phone]  Please advise

## 2021-11-13 ENCOUNTER — Encounter: Payer: Self-pay | Admitting: Internal Medicine

## 2021-11-13 ENCOUNTER — Other Ambulatory Visit: Payer: Self-pay | Admitting: Internal Medicine

## 2021-11-13 DIAGNOSIS — E559 Vitamin D deficiency, unspecified: Secondary | ICD-10-CM | POA: Insufficient documentation

## 2021-11-13 MED ORDER — VITAMIN D (ERGOCALCIFEROL) 1.25 MG (50000 UNIT) PO CAPS: 50000.0000 [IU] | ORAL_CAPSULE | ORAL | 0 refills | Status: AC

## 2021-11-17 ENCOUNTER — Other Ambulatory Visit: Payer: Self-pay

## 2021-11-17 MED ORDER — METHOCARBAMOL 750 MG PO TABS: 750.0000 mg | ORAL_TABLET | Freq: Four times a day (QID) | ORAL | 2 refills | Status: DC | PRN

## 2021-11-17 MED ORDER — NAPROXEN 500 MG PO TABS: 500.0000 mg | ORAL_TABLET | Freq: Two times a day (BID) | ORAL | 2 refills | Status: DC

## 2021-11-17 NOTE — Telephone Encounter (Signed)
Husband of patient called asking for Rx refills on behalf of patient  naproxen (NAPROSYN) 500 MG tablet methocarbamol (ROBAXIN-750) 750 MG tablet

## 2021-11-17 NOTE — Telephone Encounter (Signed)
Okay to fill? 

## 2021-11-17 NOTE — Telephone Encounter (Signed)
Refill sent.

## 2021-11-17 NOTE — Addendum Note (Signed)
Addended by: Kern Reap B on: 11/17/2021 11:51 AM   Modules accepted: Orders

## 2021-12-24 ENCOUNTER — Other Ambulatory Visit (HOSPITAL_COMMUNITY)
Admission: RE | Admit: 2021-12-24 | Discharge: 2021-12-24 | Disposition: A | Discharge status: Home / Self Care | Payer: Medicaid Other | Source: Ambulatory Visit

## 2021-12-24 ENCOUNTER — Ambulatory Visit (INDEPENDENT_AMBULATORY_CARE_PROVIDER_SITE_OTHER): Payer: Medicaid Other

## 2021-12-24 ENCOUNTER — Other Ambulatory Visit: Payer: Self-pay

## 2021-12-24 ENCOUNTER — Ambulatory Visit: Payer: Medicaid Other

## 2021-12-24 VITALS — BP 105/70 | HR 76 | Ht <= 58 in | Wt 122.2 lb

## 2021-12-24 DIAGNOSIS — Z01419 Encounter for gynecological examination (general) (routine) without abnormal findings: Secondary | ICD-10-CM

## 2021-12-24 DIAGNOSIS — Z3009 Encounter for other general counseling and advice on contraception: Secondary | ICD-10-CM | POA: Diagnosis not present

## 2021-12-24 NOTE — Progress Notes (Signed)
° °  Subjective:     Bonnie Gilmore is a 31 y.o. female here at [facility] for annual exam.  Current complaints: none.   Flowsheet Row Office Visit from 11/12/2021 in [facility] at [facility] Total Score 0       Health Maintenance Due  Topic Date Due   COVID-19 Vaccine (3 - Booster for Pfizer series) 11/07/2020   PAP-Cervical Cytology Screening  03/16/2021   PAP SMEAR-Modifier  03/16/2021     Risk factors for chronic health problems: Smoking: No Alchohol/how much: No Pt BMI: Body mass index is 29.46 kg/m.   Gynecologic History Patient's last menstrual period was 12/08/2021. Contraception: none Last Pap: 03/16/2018. Results were: normal Last mammogram: N/A  Obstetric History OB History  Gravida Para Term Preterm AB Living  2 2 2     2   SAB IAB Ectopic Multiple Live Births        0 2    # Outcome Date GA Lbr Len/2nd Weight Sex Delivery Anes PTL Lv  2 Term 03/21/20 [redacted]w[redacted]d 13:00 / 00:55 7 lb 7.6 oz (3.391 kg) M Vag-Spont EPI  LIV  1 Term 11/22/15 [redacted]w[redacted]d 22:52 / 05:03 6 lb 13.9 oz (3.116 kg) M Vag-Vacuum EPI  LIV     The following portions of the patient's history were reviewed and updated as appropriate: allergies, current medications, past family history, past medical history, past social history, past surgical history, and problem list.  Review of Systems Pertinent items are noted in HPI.    Objective:   BP 105/70    Pulse 76    Ht 4\' 6"  (1.372 m)    Wt 122 lb 3.2 oz (55.4 kg)    LMP 12/08/2021    BMI 29.46 kg/m  VS reviewed, nursing note reviewed,  Constitutional: well developed, well nourished, no distress HEENT: normocephalic CV: normal rate Pulm/chest wall: normal effort Breast Exam:  Deferred with low risks and shared decision making, discussed recommendation to start mammogram between 40-50 yo/ exam  Abdomen: soft Neuro: alert and oriented x 3 Skin: warm, dry Psych: affect normal Pelvic exam: Performed: Cervix pink, visually closed, without  lesion, scant white creamy discharge, vaginal walls and external genitalia normal Bimanual exam: Cervix 0/long/high, firm, anterior, neg CMT, uterus nontender, nonenlarged, adnexa without tenderness, enlargement, or mass    Assessment/Plan:  1. Well woman exam with routine gynecological exam - Doing well. No concerns - Cytology - PAP( [place])  2. Birth control counseling - Considering IUD vs Nexplanon - Pamphlets provided   Follow up in: 3 weeks for Nexplanon or IUD insertion   , CNM 12/24/21 2:38 PM

## 2021-12-24 NOTE — Progress Notes (Signed)
Patient presents for AEX. Patient has no concerns today. Patient would like to discuss birth control options. She thinks that she is interested in nexplanon.  Last Pap 03/16/2018  Normal

## 2021-12-25 LAB — CYTOLOGY - PAP: Diagnosis: NEGATIVE

## 2022-01-14 ENCOUNTER — Ambulatory Visit: Payer: Medicaid Other

## 2022-01-14 ENCOUNTER — Other Ambulatory Visit: Payer: Self-pay

## 2022-01-14 VITALS — BP 101/68 | Wt 122.0 lb

## 2022-01-14 DIAGNOSIS — Z7251 High risk heterosexual behavior: Secondary | ICD-10-CM

## 2022-01-14 DIAGNOSIS — Z30017 Encounter for initial prescription of implantable subdermal contraceptive: Secondary | ICD-10-CM

## 2022-01-14 DIAGNOSIS — Z01812 Encounter for preprocedural laboratory examination: Secondary | ICD-10-CM | POA: Diagnosis not present

## 2022-01-14 LAB — POCT URINE PREGNANCY: Preg Test, Ur: NEGATIVE

## 2022-01-14 MED ORDER — LEVONORGESTREL 1.5 MG PO TABS: 1.5000 mg | ORAL_TABLET | Freq: Once | ORAL | 0 refills | Status: AC

## 2022-01-14 NOTE — Progress Notes (Signed)
OFFICE VISIT PROCEDURE NOTE-NEXPLANON  History:  Bonnie Gilmore is a 31 y.o. [id number] here today for Nexplanon insertion. She reports her LMP was 01/06/2021 and was normal.  She does endorse recent UPS 2 days ago.    Past Medical History:  Diagnosis Date   Medical history non-contributory     Past Surgical History:  Procedure Laterality Date   NO PAST SURGERIES      The following portions of the patient's history were reviewed and updated as appropriate: allergies, current medications, past family history, past medical history, past social history, past surgical history and problem list.   Health Maintenance:  Normal pap and negative HRHPV on Dec 24, 2021.  No mammogram d/t age.   Review of Systems:  Genito-Urinary ROS: negative Gastrointestinal ROS: negative Objective:  Vitals: BP 101/68    LMP 01/06/2022   Physical Exam: Physical Exam Constitutional:      Appearance: Normal appearance.  HENT:     Head: Normocephalic and atraumatic.  Eyes:     Conjunctiva/sclera: Conjunctivae normal.  Cardiovascular:     Rate and Rhythm: Normal rate.  Pulmonary:     Effort: Pulmonary effort is normal. No respiratory distress.  Musculoskeletal:        General: Normal range of motion.     Cervical back: Normal range of motion.  Neurological:     Mental Status: She is alert and oriented to person, place, and time.  Skin:    General: Skin is warm and dry.  Psychiatric:        Mood and Affect: Mood normal.        Behavior: Behavior normal.        Thought Content: Thought content normal.  Vitals reviewed.     Nexplanon Insertion Procedure: SN: [id number] Exp: Oct 06, 2023 Lot: [id number]  Bonnie Gilmore requests insertion of Nexplanon for contraception method.  She understands the risks associated with insertion including pain, bleeding, infection, and paresthesias of the arm.  Patient also understands that Nexplanon can cause change in bleeding.  However, patient accepts and  understands all these risks and desires to proceed.  Nexplanon inserted as below.  Informed consent signed.   Appropriate time out taken.  Patient's non-dominant left arm was identified prepped and draped in the usual sterile fashion. The area was identified ~4 cm from epicondyle and 8 cm posterior to the sulcus between the biceps and tricep muscles. The area was prepped with alcohol swab and then injected with 3 mL of 1% lidocaine along the anticipated insertion route.  The area was then prepped with betadine and allowed 60 seconds before being wiped away with sterile gauze. Nexplanon was removed from packaging and device was confirmed within needle by provider visualization.   The device was then inserted per manufacturers instruction without complications.  The device was then palpated in the patient's arm by patient and provider. The insertion area was hemostatic with pressure and covered with steri strip and band-aid.  The arm was then wrapped with pressure dressing.  Labs and Imaging: No results found.  Assessment & Plan:  31 year old Nexplanon Insertion Recent UPSI  -Discussed and prescription sent for Plan B-One Step. -Instructed to take today for additional protection from pregnancy. -Nexplanon inserted without issues. -Post procedure instructions to include: *Remove of pressure dressing and band-aid in 12-24 hours. *Remove of steri-strips after no more than 7 days. *Condom usage or abstinence for the next 7 days. *Tylenol or ibuprofen for insertion pain/discomfort. *Call for  any other questions, concerns, or complications.    Total face-to-face time with patient: 15 minutes   Gerrit Heck, CNM 01/14/2022 11:20 AM

## 2022-01-14 NOTE — Progress Notes (Signed)
Pt requests Nexplanon. Unprotected IC x 2 nights ago UPT negative

## 2022-01-18 MED ORDER — ETONOGESTREL 68 MG ~~LOC~~ IMPL
68.0000 mg | DRUG_IMPLANT | Freq: Once | SUBCUTANEOUS | Status: AC
  Administered 2022-01-14: 68 mg via SUBCUTANEOUS

## 2022-01-18 NOTE — Addendum Note (Signed)
Addended by: Dalphine Handing on: 01/18/2022 03:29 PM   Modules accepted: Orders

## 2022-02-04 ENCOUNTER — Ambulatory Visit: Payer: Medicaid Other | Admitting: Medical

## 2022-03-09 ENCOUNTER — Ambulatory Visit: Payer: Medicaid Other | Admitting: Advanced Practice Midwife

## 2022-03-09 NOTE — Progress Notes (Deleted)
? ?  GYNECOLOGY PROGRESS NOTE ? ?History:  ?31 y.o. G2P2002 presents to [facility] office today for problem gyn visit. She reports *****.  She denies h/a, dizziness, shortness of breath, n/v, or fever/chills.   ? ?The following portions of the patient's history were reviewed and updated as appropriate: allergies, current medications, past family history, past medical history, past social history, past surgical history and problem list. Last pap smear on *** was normal, *** HRHPV. ? ?Health Maintenance Due  ?Topic Date Due  ? COVID-19 Vaccine (3 - Booster for Pfizer series) 11/07/2020  ?  ? ?Review of Systems:  ?Pertinent items are noted in HPI. ?  ?Objective:  ?Physical Exam ?not currently breastfeeding. ?VS reviewed, nursing note reviewed,  ?Constitutional: well developed, well nourished, no distress ?HEENT: normocephalic ?CV: normal rate ?Pulm/chest wall: normal effort ?Breast Exam: deferred ?Abdomen: soft ?Neuro: alert and oriented x 3 ?Skin: warm, dry ?Psych: affect normal ?Pelvic exam: Cervix pink, visually closed, without lesion, scant white creamy discharge, vaginal walls and external genitalia normal ?Bimanual exam: Cervix 0/long/high, firm, anterior, neg CMT, uterus nontender, nonenlarged, adnexa without tenderness, enlargement, or mass ? ?Assessment & Plan:  ?There are no diagnoses linked to this encounter. ? ?Sharen Counter, CNM ?8:39 AM  ?

## 2022-03-29 ENCOUNTER — Ambulatory Visit: Payer: Medicaid Other | Admitting: Advanced Practice Midwife

## 2022-03-29 NOTE — Progress Notes (Deleted)
? ?  GYNECOLOGY PROGRESS NOTE ? ?History:  ?31 y.o. G2P2002 presents to [facility] *** office today for problem gyn visit. She reports *****.  She denies h/a, dizziness, shortness of breath, n/v, or fever/chills.   ? ?The following portions of the patient's history were reviewed and updated as appropriate: allergies, current medications, past family history, past medical history, past social history, past surgical history and problem list. Last pap smear on *** was normal, *** HRHPV. ? ?Health Maintenance Due  ?Topic Date Due  ? COVID-19 Vaccine (3 - Booster for Pfizer series) 11/07/2020  ?  ? ?Review of Systems:  ?Pertinent items are noted in HPI. ?  ?Objective:  ?Physical Exam ?There were no vitals taken for this visit. ?VS reviewed, nursing note reviewed,  ?Constitutional: well developed, well nourished, no distress ?HEENT: normocephalic ?CV: normal rate ?Pulm/chest wall: normal effort ?Breast Exam: deferred ?Abdomen: soft ?Neuro: alert and oriented x 3 ?Skin: warm, dry ?Psych: affect normal ?Pelvic exam: Cervix pink, visually closed, without lesion, scant white creamy discharge, vaginal walls and external genitalia normal ?Bimanual exam: Cervix 0/long/high, firm, anterior, neg CMT, uterus nontender, nonenlarged, adnexa without tenderness, enlargement, or mass ? ?Assessment & Plan:  ?There are no diagnoses linked to this encounter. ? ?No follow-ups on file.  ? ?Sharen Counter, CNM ?8:13 AM  ?

## 2022-04-27 ENCOUNTER — Encounter: Payer: Self-pay | Admitting: Internal Medicine

## 2022-04-27 ENCOUNTER — Ambulatory Visit (INDEPENDENT_AMBULATORY_CARE_PROVIDER_SITE_OTHER): Payer: Medicaid Other | Admitting: Internal Medicine

## 2022-04-27 ENCOUNTER — Other Ambulatory Visit: Payer: Self-pay | Admitting: Internal Medicine

## 2022-04-27 VITALS — BP 110/74 | HR 70 | Temp 98.1°F | Ht 64.0 in | Wt 121.8 lb

## 2022-04-27 DIAGNOSIS — Z0000 Encounter for general adult medical examination without abnormal findings: Secondary | ICD-10-CM

## 2022-04-27 DIAGNOSIS — E559 Vitamin D deficiency, unspecified: Secondary | ICD-10-CM

## 2022-04-27 LAB — CBC WITH DIFFERENTIAL/PLATELET
Basophils Absolute: 0 10*3/uL (ref 0.0–0.1)
Basophils Relative: 0.9 % (ref 0.0–3.0)
Eosinophils Absolute: 0.2 10*3/uL (ref 0.0–0.7)
Eosinophils Relative: 4.1 % (ref 0.0–5.0)
HCT: 40 % (ref 36.0–46.0)
Hemoglobin: 13 g/dL (ref 12.0–15.0)
Lymphocytes Relative: 30.4 % (ref 12.0–46.0)
Lymphs Abs: 1.2 10*3/uL (ref 0.7–4.0)
MCHC: 32.5 g/dL (ref 30.0–36.0)
MCV: 85.2 fl (ref 78.0–100.0)
Monocytes Absolute: 0.3 10*3/uL (ref 0.1–1.0)
Monocytes Relative: 6.6 % (ref 3.0–12.0)
Neutro Abs: 2.3 10*3/uL (ref 1.4–7.7)
Neutrophils Relative %: 58 % (ref 43.0–77.0)
Platelets: 201 10*3/uL (ref 150.0–400.0)
RBC: 4.69 Mil/uL (ref 3.87–5.11)
RDW: 14.3 % (ref 11.5–15.5)
WBC: 3.9 10*3/uL — ABNORMAL LOW (ref 4.0–10.5)

## 2022-04-27 LAB — COMPREHENSIVE METABOLIC PANEL
ALT: 15 U/L (ref 0–35)
AST: 20 U/L (ref 0–37)
Albumin: 4.1 g/dL (ref 3.5–5.2)
Alkaline Phosphatase: 41 U/L (ref 39–117)
BUN: 13 mg/dL (ref 6–23)
CO2: 27 mEq/L (ref 19–32)
Calcium: 9.1 mg/dL (ref 8.4–10.5)
Chloride: 106 mEq/L (ref 96–112)
Creatinine, Ser: 0.72 mg/dL (ref 0.40–1.20)
GFR: 112.86 mL/min (ref >60.00)
Glucose, Bld: 84 mg/dL (ref 70–99)
Potassium: 4.1 mEq/L (ref 3.5–5.1)
Sodium: 139 mEq/L (ref 135–145)
Total Bilirubin: 0.5 mg/dL (ref 0.2–1.2)
Total Protein: 7.3 g/dL (ref 6.0–8.3)

## 2022-04-27 LAB — LIPID PANEL
Cholesterol: 174 mg/dL (ref 0–200)
HDL: 68.3 mg/dL (ref >39.00)
LDL Cholesterol: 96 mg/dL (ref 0–99)
NonHDL: 105.47
Total CHOL/HDL Ratio: 3
Triglycerides: 46 mg/dL (ref 0.0–149.0)
VLDL: 9.2 mg/dL (ref 0.0–40.0)

## 2022-04-27 LAB — VITAMIN B12: Vitamin B-12: 884 pg/mL (ref 211–911)

## 2022-04-27 LAB — VITAMIN D 25 HYDROXY (VIT D DEFICIENCY, FRACTURES): VITD: 19.06 ng/mL — ABNORMAL LOW (ref 30.00–100.00)

## 2022-04-27 LAB — TSH: TSH: 1.15 u[IU]/mL (ref 0.35–5.50)

## 2022-04-27 MED ORDER — VITAMIN D (ERGOCALCIFEROL) 1.25 MG (50000 UNIT) PO CAPS
50000.0000 [IU] | ORAL_CAPSULE | ORAL | 0 refills | Status: AC
Start: 2022-04-27 — End: 2022-07-14

## 2022-04-27 NOTE — Patient Instructions (Signed)
-  Nice seeing you today!! ? ?-Lab work today; will notify you once results are available. ? ?-Remember your COVID vaccine at the pharmacy. ? ?-Schedule follow up in 1 year or sooner as needed. ? ? ?

## 2022-04-27 NOTE — Progress Notes (Signed)
? ? ? ?Established Patient Office Visit ? ? ? ? ?CC/Reason for Visit: Annual preventive exam ? ?HPI: Bonnie Gilmore is a 31 y.o. female who is coming in today for the above mentioned reasons.  She has no past medical history of significance.  She takes occasional muscle relaxers or Mounjaro skeletal back pain.  She is overdue for COVID booster, she is overdue for an eye exam.  She has routine dental care.  She visited GYN and had a Pap smear and had a Nexplanon implanted. ? ?Past Medical/Surgical History: ?Past Medical History:  ?Diagnosis Date  ? Medical history non-contributory   ? ? ?Past Surgical History:  ?Procedure Laterality Date  ? NO PAST SURGERIES    ? ? ?Social History: ? reports that she has never smoked. She has never used smokeless tobacco. She reports that she does not drink alcohol and does not use drugs. ? ?Allergies: ?No Known Allergies ? ?Family History:  ?Family History  ?Problem Relation Age of Onset  ? Hypertension Mother   ? Alcohol abuse Neg Hx   ? Arthritis Neg Hx   ? Asthma Neg Hx   ? Birth defects Neg Hx   ? Cancer Neg Hx   ? COPD Neg Hx   ? Depression Neg Hx   ? Diabetes Neg Hx   ? Drug abuse Neg Hx   ? Early death Neg Hx   ? Hearing loss Neg Hx   ? Heart disease Neg Hx   ? Hyperlipidemia Neg Hx   ? Kidney disease Neg Hx   ? Learning disabilities Neg Hx   ? Mental illness Neg Hx   ? Mental retardation Neg Hx   ? Miscarriages / Stillbirths Neg Hx   ? Stroke Neg Hx   ? Vision loss Neg Hx   ? Varicose Veins Neg Hx   ? ? ? ?Current Outpatient Medications:  ?  methocarbamol (ROBAXIN-750) 750 MG tablet, Take 1 tablet (750 mg total) by mouth every 6 (six) hours as needed for muscle spasms., Disp: 30 tablet, Rfl: 2 ?  naproxen (NAPROSYN) 500 MG tablet, Take 1 tablet (500 mg total) by mouth 2 (two) times daily., Disp: 60 tablet, Rfl: 2 ? ?Review of Systems:  ?Constitutional: Denies fever, chills, diaphoresis, appetite change and fatigue.  ?HEENT: Denies photophobia, eye pain, redness, hearing loss,  ear pain, congestion, sore throat, rhinorrhea, sneezing, mouth sores, trouble swallowing, neck pain, neck stiffness and tinnitus.   ?Respiratory: Denies SOB, DOE, cough, chest tightness,  and wheezing.   ?Cardiovascular: Denies chest pain, palpitations and leg swelling.  ?Gastrointestinal: Denies nausea, vomiting, abdominal pain, diarrhea, constipation, blood in stool and abdominal distention.  ?Genitourinary: Denies dysuria, urgency, frequency, hematuria, flank pain and difficulty urinating.  ?Endocrine: Denies: hot or cold intolerance, sweats, changes in hair or nails, polyuria, polydipsia. ?Musculoskeletal: Denies myalgias, back pain, joint swelling, arthralgias and gait problem.  ?Skin: Denies pallor, rash and wound.  ?Neurological: Denies dizziness, seizures, syncope, weakness, light-headedness, numbness and headaches.  ?Hematological: Denies adenopathy. Easy bruising, personal or family bleeding history  ?Psychiatric/Behavioral: Denies suicidal ideation, mood changes, confusion, nervousness, sleep disturbance and agitation ? ? ? ?Physical Exam: ?Vitals:  ? 04/27/22 0902  ?BP: 110/74  ?Pulse: 70  ?Temp: 98.1 ?F (36.7 ?C)  ?TempSrc: Oral  ?SpO2: 98%  ?Weight: 121 lb 12.8 oz (55.2 kg)  ?Height: 5\' 4"  (1.626 m)  ? ? ?Body mass index is 20.91 kg/m?. ? ? ?Constitutional: NAD, calm, comfortable ?Eyes: PERRL, lids and conjunctivae normal ?ENMT:  Mucous membranes are moist. Posterior pharynx clear of any exudate or lesions. Normal dentition. Tympanic membrane is pearly white, no erythema or bulging. ?Neck: normal, supple, no masses, no thyromegaly ?Respiratory: clear to auscultation bilaterally, no wheezing, no crackles. Normal respiratory effort. No accessory muscle use.  ?Cardiovascular: Regular rate and rhythm, no murmurs / rubs / gallops. No extremity edema. 2+ pedal pulses. No carotid bruits.  ?Abdomen: no tenderness, no masses palpated. No hepatosplenomegaly. Bowel sounds positive.  ?Musculoskeletal: no clubbing /  cyanosis. No joint deformity upper and lower extremities. Good ROM, no contractures. Normal muscle tone.  ?Skin: no rashes, lesions, ulcers. No induration ?Neurologic: CN 2-12 grossly intact. Sensation intact, DTR normal. Strength 5/5 in all 4.  ?Psychiatric: Normal judgment and insight. Alert and oriented x 3. Normal mood.  ? ? ?Impression and Plan: ? ?Encounter for preventive health examination  ?- Plan: CBC with Differential/Platelet, Comprehensive metabolic panel, Lipid panel, TSH, Vitamin B12 ?-Recommend routine eye and dental care. ?-Immunizations: She will get COVID booster at pharmacy, her urine Tdap are up-to-date. ?-Healthy lifestyle discussed in detail. ?-Labs to be updated today. ?-Colon cancer screening: Commence age 75 ?-Breast cancer screening: Commence age 75 ?-Cervical cancer screening: Follows with GYN, completed in 2022 ?-Lung cancer screening: Not applicable ?-Prostate cancer screening: Not applicable ?-DEXA: Not applicable ? ?Vitamin D deficiency  ?- Plan: VITAMIN D 25 Hydroxy (Vit-D Deficiency, Fractures) ? ? ? ? ? ?Patient Instructions  ?-Nice seeing you today!! ? ?-Lab work today; will notify you once results are available. ? ?-Remember your COVID vaccine at the pharmacy. ? ?-Schedule follow up in 1 year or sooner as needed. ? ? ? ? ? ?Chaya Jan, MD ?[facility] ? ? ?

## 2022-04-29 ENCOUNTER — Other Ambulatory Visit: Payer: Self-pay

## 2022-04-29 DIAGNOSIS — E559 Vitamin D deficiency, unspecified: Secondary | ICD-10-CM

## 2022-05-03 ENCOUNTER — Telehealth: Payer: Self-pay | Admitting: Internal Medicine

## 2022-05-03 DIAGNOSIS — S161XXA Strain of muscle, fascia and tendon at neck level, initial encounter: Secondary | ICD-10-CM

## 2022-05-03 NOTE — Telephone Encounter (Signed)
Pt requesting a referral to either physical therapy or chiropractor. States this was discussed in her appointment on 04/27/22. States the provider needs to accept medicaid

## 2022-05-03 NOTE — Telephone Encounter (Signed)
A referral is placed.  

## 2022-05-17 ENCOUNTER — Ambulatory Visit: Payer: Medicaid Other | Admitting: Obstetrics and Gynecology

## 2022-05-17 ENCOUNTER — Encounter: Payer: Self-pay | Admitting: Obstetrics and Gynecology

## 2022-05-17 VITALS — BP 119/80 | HR 71 | Ht 64.0 in | Wt 121.4 lb

## 2022-05-17 DIAGNOSIS — Z789 Other specified health status: Secondary | ICD-10-CM

## 2022-05-17 DIAGNOSIS — Z975 Presence of (intrauterine) contraceptive device: Secondary | ICD-10-CM

## 2022-05-17 DIAGNOSIS — N939 Abnormal uterine and vaginal bleeding, unspecified: Secondary | ICD-10-CM | POA: Diagnosis not present

## 2022-05-17 DIAGNOSIS — N921 Excessive and frequent menstruation with irregular cycle: Secondary | ICD-10-CM | POA: Diagnosis not present

## 2022-05-17 MED ORDER — NORETHIN ACE-ETH ESTRAD-FE 1-20 MG-MCG(24) PO TABS: 1.0000 | ORAL_TABLET | Freq: Every day | ORAL | 11 refills | Status: DC

## 2022-05-17 NOTE — Progress Notes (Signed)
Pt presents for abnormal bleeding with Nexplanon. Nexplanon placed 01-14-22. Pt states she is not bleeding now but sometimes she has 2 periods a month and spotting throughout the month. She states she has had irregular bleeding since she had her second child in 03-2020.

## 2022-05-17 NOTE — Progress Notes (Signed)
  GYNECOLOGY PROGRESS NOTE  History:  Ms. Bonnie Gilmore is a 31 y.o. [id number] presents to [facility] office today for problem gyn visit. She reports she has had irregular vaginal bleeding since 2021. She had Nexplanon inserted 01/2022 and has continued to have irregular vaginal bleeding. She reports having 2 periods in a month or spotting throughout the month. She denies any vaginal bleeding today.  She denies h/a, dizziness, shortness of breath, n/v, or fever/chills.    The following portions of the patient's history were reviewed and updated as appropriate: allergies, current medications, past family history, past medical history, past social history, past surgical history and problem list.  Review of Systems:  Pertinent items are noted in HPI.   Objective:  Physical Exam Blood pressure 119/80, pulse 71, height 5\' 4"  (1.626 m), weight 121 lb 6.4 oz (55.1 kg), last menstrual period 04/27/2022. VS reviewed, nursing note reviewed,  Constitutional: well developed, well nourished, no distress HEENT: normocephalic CV: normal rate Pulm/chest wall: normal effort Breast Exam: deferred Abdomen: soft Neuro: alert and oriented x 3 Skin: warm, dry Psych: affect normal Pelvic exam: deferred  Assessment & Plan:  1. Breakthrough bleeding on Nexplanon - Explained that abnormal vaginal bleeding is expected within first 6 months of hormonal birth control, especially Nexplanon. - Rx for Norethindrone Acetate-Ethinyl Estrad-FE (LOESTRIN 24 FE) 1-20 MG-MCG(24) tablet; Take 1 tablet by mouth daily.  Dispense: 28 tablet; Refill: 11 - F/U in 1 month to see if OCP stops BTB - can call to cancel appt, if no longer having BTB - Patient verbalized an understanding of the plan of care and agrees.   2. Abnormal uterine bleeding (AUB)  3. Language barrier affecting health care  - [facility] In-Person [place] Interpreter, Claiborne Billings used for entire visit   Total face-to-face time discussing issues and  developing a plan of care was 15 minutes. There was 5 minutes of chart review time spent prior to this encounter. Total time spent = 20 minutes.   Laury Deep, CNM 4:49 PM

## 2022-06-07 ENCOUNTER — Ambulatory Visit: Payer: Medicaid Other | Admitting: Rehabilitative and Restorative Service Providers"

## 2022-07-11 ENCOUNTER — Other Ambulatory Visit: Payer: Self-pay | Admitting: Internal Medicine

## 2022-07-12 ENCOUNTER — Telehealth: Payer: Self-pay | Admitting: Internal Medicine

## 2022-07-12 NOTE — Telephone Encounter (Signed)
Refill requested methocarbamol (ROBAXIN-750) 750 MG tablet  [id number] [place], [place] - [address] AT [facility] [address] Phone:  [phone]  Fax:  [phone]

## 2022-07-12 NOTE — Telephone Encounter (Signed)
Last OV 04/27/22 notes: She takes occasional muscle relaxers or Mounjaro skeletal back pain.

## 2022-07-12 NOTE — Telephone Encounter (Signed)
Medication refilled to pharmacy requested  Pharmacy sent refill request & refilled in that encounter.

## 2022-07-21 ENCOUNTER — Ambulatory Visit: Payer: Medicaid Other | Admitting: Obstetrics and Gynecology

## 2022-07-22 ENCOUNTER — Other Ambulatory Visit (INDEPENDENT_AMBULATORY_CARE_PROVIDER_SITE_OTHER): Payer: Medicaid Other

## 2022-07-22 DIAGNOSIS — E559 Vitamin D deficiency, unspecified: Secondary | ICD-10-CM

## 2022-07-22 LAB — VITAMIN D 25 HYDROXY (VIT D DEFICIENCY, FRACTURES): VITD: 38.02 ng/mL (ref 30.00–100.00)

## 2022-07-29 ENCOUNTER — Other Ambulatory Visit: Payer: Self-pay | Admitting: Internal Medicine

## 2022-07-29 DIAGNOSIS — E559 Vitamin D deficiency, unspecified: Secondary | ICD-10-CM

## 2022-08-30 ENCOUNTER — Telehealth: Payer: Self-pay

## 2022-08-30 NOTE — Telephone Encounter (Signed)
Caller states his wife has had her menstruation for a month. states dizzy and vomiting.  08/27/2022 2:48:02 PM Go to ED Now Yes Stephanie Coup, RN, Karlene Einstein  Comments User: Ilda Mori, RN Date/Time Eilene Ghazi Time): 08/27/2022 2:48:22 PM 8/10 pain  Referrals GO TO FACILITY UNDECIDED  08/30/22 1426 - Pt states she did not go see the doctor. States vaginal bleeding x 1 month. Intermittent vertigo in am. Sometimes notices blood clots, sometimes big as her finger. Appt scheduled to see PCP 04/01/22 at 10a

## 2022-09-01 ENCOUNTER — Ambulatory Visit: Payer: Self-pay | Admitting: Internal Medicine

## 2022-09-16 ENCOUNTER — Ambulatory Visit: Payer: Medicaid Other | Admitting: Obstetrics and Gynecology

## 2022-10-22 ENCOUNTER — Ambulatory Visit: Payer: Self-pay | Admitting: Obstetrics and Gynecology

## 2022-12-07 ENCOUNTER — Telehealth: Payer: Self-pay

## 2022-12-07 NOTE — Telephone Encounter (Signed)
Caller states wife is complaining of chest pain  12/03/2022 1:47:25 PM Clinical Call Shawnie Dapper, RN, Arline Asp  Comments User: Christy Gentles, RN Date/Time Lamount Cohen Time): 12/03/2022 1:27:46 PM Caller states that he is not currently with the patient and she is at work. Caller attempted to reach patient with no success.  User: Christy Gentles, RN Date/Time Lamount Cohen Time): 12/03/2022 1:33:28 PM Attempted patient after 5 minutes; no answer; voicemail left.  User: Christy Gentles, RN Date/Time Lamount Cohen Time): 12/03/2022 1:42:35 PM Attempted patient again after 5 minutes; no answer, Reached out to caller (husband) to let him know I have not been able to reach her. Husband states that she is busy at work.  User: Christy Gentles, RN Date/Time Lamount Cohen Time): 12/03/2022 1:43:04 PM Advised caller that patient should step away to speak with [place] due to her symptoms.  User: Christy Gentles, RN Date/Time Lamount Cohen Time): 12/03/2022 1:47:15 PM After 5 minutes, unable to reach the patient for the 3rd time. Advised caller to have patient call us when she has a moment to speak about her symtpoms.

## 2023-01-10 ENCOUNTER — Ambulatory Visit (INDEPENDENT_AMBULATORY_CARE_PROVIDER_SITE_OTHER): Payer: 59 | Admitting: Obstetrics & Gynecology

## 2023-01-10 VITALS — BP 121/77 | HR 88 | Wt 122.0 lb

## 2023-01-10 DIAGNOSIS — N921 Excessive and frequent menstruation with irregular cycle: Secondary | ICD-10-CM | POA: Diagnosis not present

## 2023-01-10 DIAGNOSIS — Z3046 Encounter for surveillance of implantable subdermal contraceptive: Secondary | ICD-10-CM | POA: Diagnosis not present

## 2023-01-10 DIAGNOSIS — Z975 Presence of (intrauterine) contraceptive device: Secondary | ICD-10-CM | POA: Insufficient documentation

## 2023-01-10 MED ORDER — MEGESTROL ACETATE 20 MG PO TABS: 40.0000 mg | ORAL_TABLET | Freq: Every day | ORAL | 1 refills | Status: DC

## 2023-01-10 NOTE — Progress Notes (Signed)
Pt is in office to discuss nexplanon. Pt is having irregular bleeding with Nexplanon.

## 2023-01-10 NOTE — Progress Notes (Signed)
Patient ID: Bonnie Gilmore, female   DOB: 15-Jul-1991, 32 y.o.   MRN: [id number]  Chief Complaint  Patient presents with   Contraception  BTB with Nexplanon, placed 01/2022  HPI Bonnie Gilmore is a 32 y.o. female.  [id number] No LMP recorded. She has had extended episodes of BTB with Nexplanon. As directed she used OCP 2 times but is reluctant to continue to do this.Her husband is translating but mostly gives the history himself today.  HPI  Past Medical History:  Diagnosis Date   Medical history non-contributory     Past Surgical History:  Procedure Laterality Date   NO PAST SURGERIES      Family History  Problem Relation Age of Onset   Hypertension Mother    Alcohol abuse Neg Hx    Arthritis Neg Hx    Asthma Neg Hx    Birth defects Neg Hx    Cancer Neg Hx    COPD Neg Hx    Depression Neg Hx    Diabetes Neg Hx    Drug abuse Neg Hx    Early death Neg Hx    Hearing loss Neg Hx    Heart disease Neg Hx    Hyperlipidemia Neg Hx    Kidney disease Neg Hx    Learning disabilities Neg Hx    Mental illness Neg Hx    Mental retardation Neg Hx    Miscarriages / Stillbirths Neg Hx    Stroke Neg Hx    Vision loss Neg Hx    Varicose Veins Neg Hx     Social History Social History   Tobacco Use   Smoking status: Never   Smokeless tobacco: Never  Vaping Use   Vaping Use: Never used  Substance Use Topics   Alcohol use: No   Drug use: No    No Known Allergies  Current Outpatient Medications  Medication Sig Dispense Refill   megestrol (MEGACE) 20 MG tablet Take 2 tablets (40 mg total) by mouth daily. 60 tablet 1   methocarbamol (ROBAXIN) 750 MG tablet Take 1 tablet (750 mg total) by mouth every 6 (six) hours as needed for muscle spasms. 30 tablet 2   naproxen (NAPROSYN) 500 MG tablet Take 1 tablet (500 mg total) by mouth 2 (two) times daily. (Patient not taking: Reported on 01/10/2023) 60 tablet 2   No current facility-administered medications for this visit.    Review of  Systems Review of Systems  Constitutional:  Positive for fatigue.  Genitourinary:  Positive for menstrual problem. Negative for pelvic pain, vaginal bleeding and vaginal discharge.    Blood pressure 121/77, pulse 88, weight 122 lb (55.3 kg).  Physical Exam Physical Exam Vitals and nursing note reviewed.  Constitutional:      Appearance: Normal appearance.  Cardiovascular:     Rate and Rhythm: Normal rate.  Pulmonary:     Effort: Pulmonary effort is normal.  Neurological:     Mental Status: She is alert.  Psychiatric:        Mood and Affect: Mood normal.        Behavior: Behavior normal.     Data Reviewed CBC    Component Value Date/Time   WBC 3.9 (L) 04/27/2022 0935   RBC 4.69 04/27/2022 0935   HGB 13.0 04/27/2022 0935   HGB 12.8 01/08/2020 0954   HCT 40.0 04/27/2022 0935   HCT 38.0 01/08/2020 0954   PLT 201.0 04/27/2022 0935   PLT 193 01/08/2020 0954   MCV 85.2 04/27/2022  0935   MCV 92 01/08/2020 0954   MCH 28.4 04/14/2021 1640   MCHC 32.5 04/27/2022 0935   RDW 14.3 04/27/2022 0935   RDW 12.2 01/08/2020 0954   LYMPHSABS 1.2 04/27/2022 0935   LYMPHSABS 1.4 09/14/2019 1149   MONOABS 0.3 04/27/2022 0935   EOSABS 0.2 04/27/2022 0935   EOSABS 0.2 09/14/2019 1149   BASOSABS 0.0 04/27/2022 0935   BASOSABS 0.0 09/14/2019 1149     Assessment Breakthrough bleeding on Nexplanon - Plan: CBC .  Plan Meds ordered this encounter  Medications   megestrol (MEGACE) 20 MG tablet    Sig: Take 2 tablets (40 mg total) by mouth daily.    Dispense:  60 tablet    Refill:  1   RTC to review progress and discuss keeping Nexplanon vs. removal    Emeterio Reeve 01/10/2023, 11:00 AM

## 2023-01-11 LAB — CBC
Hematocrit: 38.2 % (ref 34.0–46.6)
Hemoglobin: 11.3 g/dL (ref 11.1–15.9)
MCH: 22.9 pg — ABNORMAL LOW (ref 26.6–33.0)
MCHC: 29.6 g/dL — ABNORMAL LOW (ref 31.5–35.7)
MCV: 77 fL — ABNORMAL LOW (ref 79–97)
Platelets: 264 10*3/uL (ref 150–450)
RBC: 4.94 x10E6/uL (ref 3.77–5.28)
RDW: 14.1 % (ref 11.7–15.4)
WBC: 2.8 10*3/uL — ABNORMAL LOW (ref 3.4–10.8)

## 2023-01-17 ENCOUNTER — Encounter: Payer: Self-pay | Admitting: Internal Medicine

## 2023-01-17 ENCOUNTER — Ambulatory Visit: Payer: 59 | Admitting: Internal Medicine

## 2023-01-17 ENCOUNTER — Other Ambulatory Visit: Payer: Self-pay

## 2023-01-17 VITALS — BP 120/80 | HR 70 | Temp 98.4°F | Wt 120.3 lb

## 2023-01-17 DIAGNOSIS — Z23 Encounter for immunization: Secondary | ICD-10-CM | POA: Diagnosis not present

## 2023-01-17 DIAGNOSIS — R1013 Epigastric pain: Secondary | ICD-10-CM | POA: Diagnosis not present

## 2023-01-17 DIAGNOSIS — N921 Excessive and frequent menstruation with irregular cycle: Secondary | ICD-10-CM

## 2023-01-17 MED ORDER — MEGESTROL ACETATE 20 MG PO TABS: 40.0000 mg | ORAL_TABLET | Freq: Every day | ORAL | 1 refills | Status: DC

## 2023-01-17 MED ORDER — PANTOPRAZOLE SODIUM 40 MG PO TBEC: 40.0000 mg | DELAYED_RELEASE_TABLET | Freq: Every day | ORAL | 1 refills | Status: AC

## 2023-01-17 NOTE — Progress Notes (Signed)
Established Patient Office Visit     CC/Reason for Visit: Chest pain  HPI: Bonnie Gilmore is a 32 y.o. female who is coming in today for the above mentioned reasons.  For the past month she has been complaining of center of chest chest discomfort.  Unfortunately she does not clearly speak [place] and is here today with her husband who is answering most questions for her.  She waived her right to an Research officer, political party.  She has noticed pain that she describes as burning in the center of her chest.  It is worse after eating and at nighttime.  She has had increased belching.  She also feels the discomfort when she presses firmly in the center of her chest.  It is not related to exertion.   Past Medical/Surgical History: Past Medical History:  Diagnosis Date   Medical history non-contributory     Past Surgical History:  Procedure Laterality Date   NO PAST SURGERIES      Social History:  reports that she has never smoked. She has never used smokeless tobacco. She reports that she does not drink alcohol and does not use drugs.  Allergies: No Known Allergies  Family History:  Family History  Problem Relation Age of Onset   Hypertension Mother    Alcohol abuse Neg Hx    Arthritis Neg Hx    Asthma Neg Hx    Birth defects Neg Hx    Cancer Neg Hx    COPD Neg Hx    Depression Neg Hx    Diabetes Neg Hx    Drug abuse Neg Hx    Early death Neg Hx    Hearing loss Neg Hx    Heart disease Neg Hx    Hyperlipidemia Neg Hx    Kidney disease Neg Hx    Learning disabilities Neg Hx    Mental illness Neg Hx    Mental retardation Neg Hx    Miscarriages / Stillbirths Neg Hx    Stroke Neg Hx    Vision loss Neg Hx    Varicose Veins Neg Hx      Current Outpatient Medications:    cholecalciferol (VITAMIN D3) 25 MCG (1000 UNIT) tablet, Take 1,000 Units by mouth daily., Disp: , Rfl:    pantoprazole (PROTONIX) 40 MG tablet, Take 1 tablet (40 mg total) by mouth daily., Disp: 90 tablet,  Rfl: 1  Review of Systems:  Negative unless indicated in HPI.   Physical Exam: Vitals:   01/17/23 1046  BP: 120/80  Pulse: 70  Temp: 98.4 F (36.9 C)  TempSrc: Oral  SpO2: 100%  Weight: 120 lb 4.8 oz (54.6 kg)    Body mass index is 20.65 kg/m.   Physical Exam Vitals reviewed.  Constitutional:      Appearance: Normal appearance.  HENT:     Head: Normocephalic and atraumatic.  Eyes:     Conjunctiva/sclera: Conjunctivae normal.     Pupils: Pupils are equal, round, and reactive to light.  Cardiovascular:     Rate and Rhythm: Normal rate and regular rhythm.  Pulmonary:     Effort: Pulmonary effort is normal.     Breath sounds: Normal breath sounds.  Abdominal:     General: Bowel sounds are normal.     Palpations: Abdomen is soft.  Skin:    General: Skin is warm and dry.  Neurological:     General: No focal deficit present.     Mental Status: She is alert and  oriented to person, place, and time.  Psychiatric:        Mood and Affect: Mood normal.        Behavior: Behavior normal.        Thought Content: Thought content normal.        Judgment: Judgment normal.      Impression and Plan:  Dyspepsia - Plan: pantoprazole (PROTONIX) 40 MG tablet  Immunization due  -Symptoms sound like possibly GERD/dyspepsia. -I will initiate a trial of PPI therapy.  She will follow-up with me in 4 to 6 weeks. -Flu vaccine administered today.  Time spent:31 minutes reviewing chart, interviewing and examining patient and formulating plan of care.     Lelon Frohlich, MD [facility]

## 2023-02-21 ENCOUNTER — Ambulatory Visit: Payer: 59 | Admitting: Obstetrics & Gynecology

## 2023-03-10 ENCOUNTER — Telehealth: Payer: Self-pay | Admitting: Internal Medicine

## 2023-03-10 NOTE — Telephone Encounter (Signed)
Pt husband is calling and has new pharm and would like methocarbamol (ROBAXIN) 750 MG tablet . Pt husband is aware md not in office and refill can take up to 3 business days.  [id number] - [place], [place]. Phone: [phone]  Fax: [phone]

## 2023-12-29 ENCOUNTER — Ambulatory Visit (INDEPENDENT_AMBULATORY_CARE_PROVIDER_SITE_OTHER): Payer: Self-pay | Admitting: Obstetrics and Gynecology

## 2023-12-29 VITALS — BP 132/84 | HR 84 | Ht 65.0 in | Wt 123.0 lb

## 2023-12-29 DIAGNOSIS — Z758 Other problems related to medical facilities and other health care: Secondary | ICD-10-CM

## 2023-12-29 DIAGNOSIS — Z3046 Encounter for surveillance of implantable subdermal contraceptive: Secondary | ICD-10-CM

## 2023-12-29 DIAGNOSIS — Z603 Acculturation difficulty: Secondary | ICD-10-CM

## 2023-12-29 DIAGNOSIS — Z3040 Encounter for surveillance of contraceptives, unspecified: Secondary | ICD-10-CM

## 2023-12-29 NOTE — Patient Instructions (Addendum)
Nexplanon Instructions After Removal  Keep bandage clean and dry for 24 hours  May use ice/Tylenol/Ibuprofen for soreness or pain  If you develop fever, drainage or increased warmth from incision site-contact office immediately   

## 2023-12-29 NOTE — Progress Notes (Signed)
Bonnie Gilmore is a [id number] female in the office today for removal of Nexplanon; which was inserted 01/14/2022 at [facility]. She desires to have it removed today. She reports her bleeding is sometimes 1 month long and heavy; then spotting some other months.  BP 132/84   Pulse 84   Ht 5\' 5"  (1.651 m)   Wt 123 lb (55.8 kg)   BMI 20.47 kg/m    Procedure Note: Consent obtained and Time-Out conducted Implant palpated in left upper arm Betadine prep done on area of excision/removal Lidocaine infiltrated into intradermal and subcutaneous space Small 2mm incision made with scalpel Pressure applied to distal end of implant which exposed the tip through incision Tip of implant grasped with hemostat There was some adherence of implant in subcutaneous tissue.  Twisting and manipulation freed the implant from the capsule Implant removed Pressure held on incision until bleeding stopped Steristrips applied to incision Pressure dressing applied by RN Patient tolerated procedure well.   Assessment and Plan: 1) Encounter for surveillance of contraceptive device  2) Encounter for Nexplanon removal  - Patient declines all other forms of birth control at this time - Information provided on Nexplanon After Care Instructions   3) Language Barrier affecting healthcare - [facility] Interpreter, Velva, prn used for entire visit. Patient speaks and understands some [place]. She only needed interpreter for some areas of the assessment, procedure, after care instructions and any questions she had.  Total face-to-face time spent during this encounter was 10 minutes. There was 5 minutes of chart review time spent prior to this encounter. Total time spent = 15 minutes.   Raelyn Mora, CNM  12/29/2023 11:36 AM

## 2023-12-29 NOTE — Progress Notes (Signed)
Pt is in the office for Nexplanon removal, originally inserted 01/14/2022.  Pt desires removal due to irregular bleeding, declines other BC. LMP 12/26/23
